# Patient Record
Sex: Female | Born: 1975 | Race: White | Hispanic: No | Marital: Married | Smoking: Never smoker
Health system: Southern US, Community
[De-identification: ages and names within clinical notes are randomized; demographics above are authoritative.]

## PROBLEM LIST (undated history)

## (undated) DIAGNOSIS — M954 Acquired deformity of chest and rib: Secondary | ICD-10-CM

## (undated) DIAGNOSIS — B019 Varicella without complication: Secondary | ICD-10-CM

## (undated) DIAGNOSIS — O039 Complete or unspecified spontaneous abortion without complication: Secondary | ICD-10-CM

## (undated) DIAGNOSIS — A499 Bacterial infection, unspecified: Secondary | ICD-10-CM

## (undated) DIAGNOSIS — N390 Urinary tract infection, site not specified: Secondary | ICD-10-CM

## (undated) HISTORY — DX: Urinary tract infection, site not specified: A49.9

## (undated) HISTORY — DX: Acquired deformity of chest and rib: M95.4

## (undated) HISTORY — DX: Varicella without complication: B01.9

## (undated) HISTORY — DX: Complete or unspecified spontaneous abortion without complication: O03.9

## (undated) HISTORY — DX: Urinary tract infection, site not specified: N39.0

## (undated) HISTORY — DX: Bacterial infection, unspecified: A49.9

## (undated) HISTORY — PX: TONSILLECTOMY: SUR1361

---

## 2004-06-27 ENCOUNTER — Ambulatory Visit: Payer: Self-pay | Admitting: Pulmonary Disease

## 2004-08-31 ENCOUNTER — Inpatient Hospital Stay (HOSPITAL_COMMUNITY): Admission: AD | Admit: 2004-08-31 | Discharge: 2004-08-31 | Payer: Self-pay | Admitting: Obstetrics & Gynecology

## 2004-11-20 ENCOUNTER — Inpatient Hospital Stay (HOSPITAL_COMMUNITY): Admission: AD | Admit: 2004-11-20 | Discharge: 2004-11-25 | Payer: Self-pay | Admitting: Obstetrics and Gynecology

## 2004-11-21 ENCOUNTER — Encounter (INDEPENDENT_AMBULATORY_CARE_PROVIDER_SITE_OTHER): Payer: Self-pay | Admitting: Specialist

## 2005-05-18 LAB — HM MAMMOGRAPHY

## 2005-05-22 ENCOUNTER — Encounter: Admission: RE | Admit: 2005-05-22 | Discharge: 2005-05-22 | Payer: Self-pay | Admitting: Obstetrics and Gynecology

## 2006-02-05 ENCOUNTER — Encounter: Admission: RE | Admit: 2006-02-05 | Discharge: 2006-02-05 | Payer: Self-pay | Admitting: Obstetrics and Gynecology

## 2007-10-03 LAB — CBC WITH DIFFERENTIAL/PLATELET
Ab Scrn: NEGATIVE
Granulocyte percent: 66.2 %G (ref 37–80)
HBsAg Conf: NONREACTIVE
HCT: 40 %
HGB: 13.5 g/dL
HIV 1/O/2 Abs, Qual: NONREACTIVE
HIV 1/O/2 Abs-Index Value: <1
MCH: 30.1
MCHC: 33.7
MCV: 89.5 fL (ref 78–100)
MID (cbc): 0.3 (ref 0–0.9)
Midpiece Defect, %: 4.2
RBC: 4.49
RDW: 12.7
RPR Ser-Titr: NONREACTIVE
WBC: 6.5
lymph#: 1.9

## 2008-07-20 LAB — RHEUMATOID FACTOR
HCT: 42 %
HGB: 14.6 g/dL
HIV 1/O/2 Abs-Index Value: <1
Hepatitis B Surface Antigen: NONREACTIVE
MCH: 31.3
MCV: 90.8 fL (ref 78–100)
RBC: 4.67
RDW: 34.5
RPR Ser-Titr: NONREACTIVE
Rubella Antibodies, IGG: UNDETERMINED

## 2008-12-21 LAB — RPR: RPR Ser-Titr: NONREACTIVE

## 2008-12-21 LAB — CBC WITH DIFFERENTIAL/PLATELET
HCT: 37 %
Hemoglobin: 13.2 g/dL (ref 11.8–15.5)
MCV: 91.4 fL (ref 78–100)
Platelets: 188 10*3/uL
RDW: 13.1
WBC: 8.2

## 2009-02-11 LAB — COMPLETE METABOLIC PANEL WITH GFR
ALT: 10 U/L (ref 7–35)
AST: 17 U/L
AST: 17 U/L
Albumin/Globulin Ratio: 1.2
Albumin/Globulin Ratio: 1.2
Albumin: 3.4
Alkaline Phosphatase: 156 U/L
Alkaline Phosphatase: 156 U/L
BUN, Bld: 6
BUN, Bld: 6
BUN/Creatinine Ratio: 13
BUN/Creatinine Ratio: 13
Calcium, serum, ionized: 9.5
Calcium: 9.5 mg/dL
Carbon Disulfide: 26
Creat: 0.48
Creatine, Serum: 0.48
GFR, Est African American: >59
GFR: >59
Globulin, Total: 2.8
Globulin: 2.8
Glucose: 80
HIV 1/O/2 Abs-Index Value: <1
Potassium: 4.8 mmol/L
Protein S Total: 6.2
Sodium, serum: 137
Sodium: 137 mmol/L (ref 137–147)

## 2009-02-11 LAB — CBC
HCT: 39 %
Hemoglobin: 13.6 g/dL (ref 11.8–15.5)
MCV: 92.5 fL (ref 78–100)
Platelets: 183 10*3/uL
RBC: 4.16
WBC: 8.6

## 2009-02-28 ENCOUNTER — Inpatient Hospital Stay (HOSPITAL_COMMUNITY): Admission: RE | Admit: 2009-02-28 | Discharge: 2009-03-02 | Payer: Self-pay | Admitting: Obstetrics

## 2009-04-12 LAB — TSH: TSH: 1.12

## 2010-11-19 LAB — CBC
HCT: 33 % — ABNORMAL LOW (ref 36.0–46.0)
HCT: 37.2 % (ref 36.0–46.0)
Hemoglobin: 11.3 g/dL — ABNORMAL LOW (ref 12.0–15.0)
Hemoglobin: 13.4 g/dL (ref 12.0–15.0)
MCHC: 34.2 g/dL (ref 30.0–36.0)
MCHC: 35.9 g/dL (ref 30.0–36.0)
MCV: 95.1 fL (ref 78.0–100.0)
MCV: 96.1 fL (ref 78.0–100.0)
Platelets: 178 10*3/uL (ref 150–400)
Platelets: 200 10*3/uL (ref 150–400)
RBC: 3.44 MIL/uL — ABNORMAL LOW (ref 3.87–5.11)
RBC: 3.91 MIL/uL (ref 3.87–5.11)
RDW: 12.7 % (ref 11.5–15.5)
RDW: 13.4 % (ref 11.5–15.5)
WBC: 8.3 10*3/uL (ref 4.0–10.5)
WBC: 9.3 10*3/uL (ref 4.0–10.5)

## 2010-11-19 LAB — RPR: RPR Ser Ql: NONREACTIVE

## 2010-12-29 NOTE — H&P (Signed)
NAME:  Jessica Osborne, Jessica Osborne NO.:  [id number]   MEDICAL RECORD NO.:  [id number]          PATIENT TYPE:  MAT   LOCATION:  [facility]                          FACILITY:  [facility]   PHYSICIAN:  Richardean Sale, M.D.   DATE OF BIRTH:  1976/04/21   DATE OF ADMISSION:  11/20/2004  DATE OF DISCHARGE:                                HISTORY & PHYSICAL   ADMITTING DIAGNOSIS:  A 40+-week intrauterine pregnancy with borderline  oligohydramnios and nonreassuring biophysical profile.   HISTORY OF PRESENT ILLNESS:  This is a 35 year old gravida 1, para 0 white  female at 40 weeks and 3 days gestation with a due date of November 17, 2004 by  LMP, consistent with ultrasound, who presented today to the office  complaining of increasing pelvic cramping, low back pain, and decreased  fetal movement.  The patient underwent ultrasound, which revealed an  amniotic fluid index of 7 and a biophysical profile of 4/8.  Prenatal care  has been at [facility] with Dr. Richardean Sale as the primary  attending.  The pregnancy has been uncomplicated.   PAST MEDICAL HISTORY:  No prior hospitalizations.   PAST SURGICAL HISTORY:  Tonsillectomy.   OBSTETRICAL HISTORY:  Gravida 1, para 0.   GYNECOLOGIC HISTORY:  Remote history of Chlamydia and cryotherapy for  abnormal Pap smear.   SOCIAL HISTORY:  She denies tobacco, alcohol, or drugs and is married.   FAMILY HISTORY:  Positive for lupus in her mother.  Aunt died from liver  cancer.  Maternal grandfather died of stroke.  No breast, ovarian, uterine,  or colon cancer.  No babies born with any birth defects, congenital  anomalies, Down syndrome, spina bifida, cystic fibrosis.   PHYSICAL EXAMINATION:  VITAL SIGNS:  Blood pressure is 140s-150s/90s, pulse  regular, afebrile.  GENERAL:  She is a well-developed, well-nourished white female who is in no  acute distress.  HEART:  Regular rate and rhythm.  LUNGS:  Lungs clear to auscultation.  ABDOMEN:  Abdomen  gravid, soft, and nontender.  Fundal height is only 35.  Heart tones in the 150s.  EXTREMITIES:  No cyanosis, clubbing, or edema.  NEUROLOGICAL EXAM:  Nonfocal.  Cervix is fingertip, 70%, -1 station, vertex.  Ultrasound reveals an estimated fetal weight of 3463 g.  Amniotic fluid  index is 7, which is the 8th percentile.  Biophysical profile is 4/8 with -2  for tone and -2 for breathing.   PRENATAL LABORATORIES:  Blood type is B-, antibody screen negative, RPR  nonreactive, Rubella immune, hepatitis B surface antigen nonreactive, HIV  declined.  Toxoplasmosis titer is negative.  Triple test within normal  limits.  One-hour Glucola 103.  Group B beta strep negative.  Status post  RhoGAM at 28 weeks.   ASSESSMENT:  A 35 year old gravid 1, para 0 white female at 40+ weeks  gestation with borderline oligohydramnios and biophysical profile of only  4/8.   PLAN:  1.  I recommend induction of labor given biophysical profile.  If fetal      heart tracing is reactive, we will proceed with Cervidil for cervical  ripening followed by Pitocin and amniotomy.  2.  Continue fetal monitoring.  3.  Elevated blood pressures.  We will check preeclampsia labs and monitor      pressures closely.  4.  Reviewed with the patient and husband increased risk of cesarean section      given that she is primigravida and her cervix is unfavorable.  The      patient and husband voiced understanding of plan and recommendation for      induction and slightly increased risk of cesarean section and agree with      the above.  All questions have been answered.      JW/MEDQ  D:  11/20/2004  T:  11/20/2004  Job:  [id number]

## 2010-12-29 NOTE — Discharge Summary (Signed)
Jessica Osborne, Jessica Osborne                ACCOUNT NO.:  [id number]   MEDICAL RECORD NO.:  [id number]          PATIENT TYPE:  INP   LOCATION:  9105                          FACILITY:  [facility]   PHYSICIAN:  Richardean Sale, M.D.   DATE OF BIRTH:  Apr 21, 1976   DATE OF ADMISSION:  11/20/2004  DATE OF DISCHARGE:  11/25/2004                                 DISCHARGE SUMMARY   ADMISSION DIAGNOSIS:  A 40+ week intrauterine pregnancy with borderline  oligohydramnios and non-reassuring biophysical profile.   DISCHARGE DIAGNOSES:  1.  Same.  2. Status post vaginal delivery.  3. Severe pre-eclampsia with      HELLP syndrome.  4. Vulvar hematoma.   HOSPITAL COURSE AND HISTORY OF PRESENT ILLNESS:  This is a 35 year old  gravida 1 white female who was admitted for 40 weeks and 3 days gestation  secondary to decreased fetal movement.  The patient underwent ultrasound and  biophysical profile which showed an amniotic fluid index of 7 and a  biophysical profile of only 4/8.  She was subsequently admitted for  induction of labor and Cervidil overnight.  The patient's blood pressures on  admission were slightly elevated.  Pre-eclampsia labs were drawn on  admission which showed mild elevation in her SGOT and SGPT.  The patient was  subsequently started on magnesium sulfate for seizure prophylaxis.  She  underwent a successful induction of labor.  The patient rapidly progressed  in labor with Cervidil and prior to placing her epidural, repeat  laboratories or studies were drawn.  Her platelet count which was initially  153,000 on admission returned at only 61,000.  The patient subsequently was  unable to receive an epidural.  She had a successful vaginal delivery of a  viable female infant with Apgars of 8 and 8.  Postpartum, the patient was  admitted to the [facility] and continued on magnesium sulfate for seizure  prophylaxis.  The patient's laboratory studies were consistent with HELLP  syndrome.  Her platelet count  dropped to the 40,000s.  ALT and AST were in  the 100s.  LDH was elevated 455.  Coagulation studies were normal.  On  postpartum day #1, the patient developed spontaneous vulvar hematoma that  was treated conservatively and remained stable throughout her postpartum  stay.  The patient received dexamethasone given that her thrombocytopenia  persisted into postpartum day  #2.  The patient ultimately showed  improvement in her laboratory studies as well as in her blood pressures.  Hemoglobin stabilized at 7.4.  The patient did not receive a transfusion as  she was hemodynamically stable and was asymptomatic.  The patient continued  to improve and on postpartum day #4, she was discharged to home in stable  condition.   DISPOSITION:  To home.   CONDITION:  Stable.   FOLLOW UP:  The patient will follow up in one week for repeat blood pressure  check and for monitoring the vulvar hematoma and then again in four to six  weeks for a routine postpartum visit.   INSTRUCTIONS:  She is to call for any headache, blurred vision, epigastric  pain, heavy vaginal bleeding, pain not relieved with pain medications, fever  greater than 100.5.   DISCHARGE MEDICATIONS:  1.  Iron supplement 325 mg twice daily.  2. Motrin 800 mg p.o. q.8h. p.r.n.      pain.      JW/MEDQ  D:  12/28/2004  T:  12/29/2004  Job:  [id number]

## 2011-08-02 ENCOUNTER — Other Ambulatory Visit (HOSPITAL_BASED_OUTPATIENT_CLINIC_OR_DEPARTMENT_OTHER): Payer: Self-pay | Admitting: Family Medicine

## 2011-08-02 ENCOUNTER — Ambulatory Visit (HOSPITAL_BASED_OUTPATIENT_CLINIC_OR_DEPARTMENT_OTHER)
Admission: RE | Admit: 2011-08-02 | Discharge: 2011-08-02 | Disposition: A | Discharge status: Home / Self Care | Payer: BC Managed Care – PPO | Source: Ambulatory Visit | Attending: Family Medicine | Admitting: Family Medicine

## 2011-08-02 ENCOUNTER — Other Ambulatory Visit (HOSPITAL_BASED_OUTPATIENT_CLINIC_OR_DEPARTMENT_OTHER): Payer: Self-pay | Admitting: *Deleted

## 2011-08-02 DIAGNOSIS — R109 Unspecified abdominal pain: Secondary | ICD-10-CM

## 2011-08-02 DIAGNOSIS — R319 Hematuria, unspecified: Secondary | ICD-10-CM

## 2011-08-02 DIAGNOSIS — R599 Enlarged lymph nodes, unspecified: Secondary | ICD-10-CM | POA: Insufficient documentation

## 2011-08-02 DIAGNOSIS — J984 Other disorders of lung: Secondary | ICD-10-CM

## 2011-08-02 DIAGNOSIS — R1012 Left upper quadrant pain: Secondary | ICD-10-CM

## 2011-08-02 DIAGNOSIS — R918 Other nonspecific abnormal finding of lung field: Secondary | ICD-10-CM

## 2011-08-02 DIAGNOSIS — R509 Fever, unspecified: Secondary | ICD-10-CM | POA: Insufficient documentation

## 2011-08-02 MED ORDER — IOHEXOL 300 MG/ML  SOLN: 80.0000 mL | Freq: Once | INTRAMUSCULAR | Status: AC | PRN

## 2011-10-16 LAB — LIPID PANEL
Cholesterol, Total: 171
HDL: 77 mg/dL — AB (ref 35–70)
LDL CALC: 87
Triglycerides: 34
VLDL: 7 mg/dL

## 2011-10-16 LAB — HM PAP SMEAR: HM Pap smear: NEGATIVE

## 2011-10-16 LAB — HPV, HIGH+LOW-RISK
HPV, high-risk: NEGATIVE
TSH: 1.48

## 2011-11-27 LAB — COMPLETE METABOLIC PANEL WITHOUT GFR
ALT: 10 U/L (ref 7–35)
AST: 12 U/L
Albumin/Globulin Ratio: 1.6
Albumin: 4.5
Alkaline Phosphatase: 58 U/L
BUN/Creatinine Ratio: 21
BUN: 13 mg/dL (ref 4–21)
Calcium: 9 mg/dL
Carbon Disulfide: 24
Chloride: 103 mmol/L
Creat: 0.63
GFR, Est African American: 133
GFR, Est Non African American: 115
Globulin, Total: 2.9
Glucose: 85
Hgb A1c MFr Bld: 5.2 % (ref 4.0–6.0)
Potassium: 4.2 mmol/L
Sodium: 140 mmol/L (ref 137–147)
TSH: 1.48
Total Bilirubin: 0.8 mg/dL
Total Protein: 7.4 g/dL

## 2011-11-27 LAB — COMPLETE METABOLIC PANEL WITH GFR
Albumin, Serum(Neph): 4.5
Albumin/Globulin Ratio: 1.6
Alkaline Phosphatase: 58 U/L
BUN/Creatinine Ratio: 21
BUN: 13 mg/dL (ref 4–21)
Calcium, serum, ionized: 9
Chloride, Serum: 103
Creatinine, Ser: 0.63
GFR, Est Non African American: 115
Globulin, Total: 2.9
Glucose: 85
Hgb A1c MFr Bld: 5.2 % (ref 4.0–6.0)
Protein S Total: 7.4
Sodium, serum: 140

## 2011-11-27 LAB — CBC
Cholesterol, Total: 171
HDL: 77 mg/dL — AB (ref 35–70)
LDL Calculated: 87 mg/dL
Triglycerides: 34
VLDL Cholesterol Cal: 7

## 2013-02-17 DIAGNOSIS — J45909 Unspecified asthma, uncomplicated: Secondary | ICD-10-CM | POA: Insufficient documentation

## 2013-02-25 LAB — COMPLETE METABOLIC PANEL WITH GFR
AST: 12 U/L
Albumin/Glob SerPl: 1
Alkaline Phosphatase: 57 U/L
Antichromatin IgG, Antibodies: <0.2
BUN/Creatinine Ratio: 14
CO2: 23 mmol/L
Creatinine, Ser: 0.64
Globulin, Total: 4.2
Latex: 8.4
SSA (Ro) Ab, IgG-Sjogrens: <0.2
SSB (La) (ENA) Antibody, IgG: <0.2
Sed Rate: 28
Smith-Lemli-Opitz Scrn, P: <0.2
Sodium: 138 mmol/L (ref 137–147)
Total Bilirubin: 0.8 mg/dL

## 2013-02-25 LAB — IMMUNOFIXATION ELECTROPHORESIS
Albumin/Glob SerPl: 3.7
Beta Globulin: 0.8
Gamma Globulin: 1.1
Globulin, Total: 1.5
IFE Result, Cryoprecipitant: 1.4

## 2013-03-09 ENCOUNTER — Telehealth: Payer: Self-pay | Admitting: Critical Care Medicine

## 2013-03-09 NOTE — Telephone Encounter (Signed)
I spoke with pt. She is scheduled to see RA in [place] tomorrow at 10:15. Aware to arrive at 10 to fill out paperwork. She is also aware of address. Nothing further was needed

## 2013-03-10 ENCOUNTER — Institutional Professional Consult (permissible substitution): Payer: BC Managed Care – PPO | Admitting: Pulmonary Disease

## 2013-03-11 ENCOUNTER — Encounter: Payer: Self-pay | Admitting: Emergency Medicine

## 2013-03-11 ENCOUNTER — Ambulatory Visit (INDEPENDENT_AMBULATORY_CARE_PROVIDER_SITE_OTHER): Payer: BC Managed Care – PPO | Admitting: Emergency Medicine

## 2013-03-11 VITALS — BP 110/80 | HR 79 | Temp 97.9°F | Ht 66.0 in | Wt 108.8 lb

## 2013-03-11 DIAGNOSIS — R9389 Abnormal findings on diagnostic imaging of other specified body structures: Secondary | ICD-10-CM | POA: Insufficient documentation

## 2013-03-11 DIAGNOSIS — J45909 Unspecified asthma, uncomplicated: Secondary | ICD-10-CM | POA: Insufficient documentation

## 2013-03-11 NOTE — Patient Instructions (Addendum)
We will get copies of your CXR and CT scan from [facility] Please stop your Pulmicort for now Continue to have albuterol available to use as needed for shortness of breath We will perform full pulmonary function testing at your next office visit Follow with Dr Delton Coombes next available appointment with full PFT's

## 2013-03-11 NOTE — Progress Notes (Signed)
Subjective:    Patient ID: Jessica Osborne, female    DOB: 02-16-1976, 37 y.o.   MRN: [id number]  HPI 37 yo never smoker, hx of HA and little other PMH xcept for 2 episodes of CAP in 2012, 2013 in LLL. These were characterized by L flank and back pain, f/c, aches, little cough without mucous. Then in May '14 had the pain, dyspnea. Could hurt w insp, movement, still happens. Some cough, non productive. She was started on pulmicort in May, seemed to help her breathing some. Albuterol prn also helpful. CT scan in May showed LLL infiltrate with ? Nodular component. Spirometry recently w ? Obstruction.  She was tested for autoimmune processes July > all negative (labs scanned in).    CT 08/02/11 >> LL PNA with some associated mediastinal LAD CT scan 02/26/13 >> unavailable to me; radiology report shows infiltrate dorsal LLL with small irregular nodular opacity 6x31mm, no mediastinal LAD.    Review of Systems  Constitutional: Positive for unexpected weight change. Negative for fever.  HENT: Positive for dental problem. Negative for ear pain, nosebleeds, congestion, sore throat, rhinorrhea, sneezing, trouble swallowing, postnasal drip and sinus pressure.   Eyes: Negative for redness and itching.  Respiratory: Positive for chest tightness and shortness of breath. Negative for cough and wheezing.   Cardiovascular: Positive for chest pain. Negative for palpitations and leg swelling.  Gastrointestinal: Negative for nausea and vomiting.  Genitourinary: Negative for dysuria.  Musculoskeletal: Negative for joint swelling.  Skin: Negative for rash.  Neurological: Positive for headaches.  Hematological: Does not bruise/bleed easily.  Psychiatric/Behavioral: Negative for dysphoric mood. The patient is not nervous/anxious.    No past medical history on file.    Family History  Problem Relation Age of Onset  . Melanoma Maternal Aunt   . Asthma Brother   . Allergies Brother   . Rheum arthritis Mother       History   Social History  . Marital Status: Married    Spouse Name: N/A    Number of Children: 2  . Years of Education: N/A   Occupational History  . stay at home mother    Social History Main Topics  . Smoking status: Never Smoker   . Smokeless tobacco: Never Used  . Alcohol Use: No  . Drug Use: No  . Sexually Active: Not on file   Other Topics Concern  . Not on file   Social History Narrative  . No narrative on file  No current breathing exposures. She had a former home with aspergillus exposure, got out 1 yr ago. No pets. No bird exposure.   Allergies  Allergen Reactions  . Penicillins Rash     No outpatient prescriptions prior to visit.   No facility-administered medications prior to visit.       Objective:   Physical Exam Filed Vitals:   03/11/13 1046  BP: 110/80  Pulse: 79  Temp: 97.9 F (36.6 C)  TempSrc: Oral  Height: 5\' 6"  (1.676 m)  Weight: 108 lb 12.8 oz (49.351 kg)  SpO2: 100%   Gen: Pleasant, thin, in no distress,  normal affect  ENT: No lesions,  mouth clear,  oropharynx clear, no postnasal drip  Neck: No JVD, no TMG, no carotid bruits  Lungs: No use of accessory muscles, few soft exp squeeks at end-exp on L  Cardiovascular: RRR, heart sounds normal, no murmur or gallops, no peripheral edema  Musculoskeletal: No deformities, no cyanosis or clubbing, mild L intercostal tenderness to palp  Neuro: alert, non focal  Skin: Warm, no lesions or rashes     Assessment & Plan:  Unspecified asthma(493.90) Unclear dx - certainly there are inflammatory diseases that will cause nodular infiltrates and obstructive disease.  - stop pulmicort for now - full PFT - rov next available  Abnormal CT scan, chest Etiology unclear, but consider a primary inflammatory process, atypical infxn, least likely would be malignancy. Her autoimmune panel from 02/25/13 is negative (has family hx RA and SLE) - need to get the CT scan and CXR from [facility] -  consider FOB or alternative bx depending on the CT

## 2013-03-11 NOTE — Assessment & Plan Note (Signed)
Etiology unclear, but consider a primary inflammatory process, atypical infxn, least likely would be malignancy. Her autoimmune panel from 02/25/13 is negative (has family hx RA and SLE) - need to get the CT scan and CXR from [facility] - consider FOB or alternative bx depending on the CT

## 2013-03-11 NOTE — Assessment & Plan Note (Signed)
Unclear dx - certainly there are inflammatory diseases that will cause nodular infiltrates and obstructive disease.  - stop pulmicort for now - full PFT - rov next available

## 2013-03-13 ENCOUNTER — Telehealth: Payer: Self-pay | Admitting: Emergency Medicine

## 2013-03-13 NOTE — Telephone Encounter (Signed)
Received 10 pages from [facility], sent to Dr. Delton Coombes. 03/13/13/ss

## 2013-03-13 NOTE — Telephone Encounter (Signed)
Her records have arrived, but the new disk has not gotten here yet. I need this to compare the films. We have asked for it.

## 2013-03-13 NOTE — Telephone Encounter (Signed)
I spoke with pt. She is wanting to know if we were able to view her images yet. She saw RB 03/11/13. Please advise RB thanks

## 2013-03-13 NOTE — Telephone Encounter (Signed)
Received 3 pages from [facility], sent to Dr. Delton Coombes on 03/13/13/ss

## 2013-03-16 ENCOUNTER — Telehealth: Payer: Self-pay | Admitting: Emergency Medicine

## 2013-03-16 NOTE — Telephone Encounter (Signed)
Spoke with patient, Made her aware disc have not arrived yet but we have requested for them Patient verbalized understanding and nothing further needed at this time.

## 2013-03-16 NOTE — Telephone Encounter (Signed)
Leslye Peer, MD at 03/13/2013 5:14 PM    Status: Signed             Her records have arrived, but the new disk has not gotten here yet. I need this to compare the films. We have asked for it   Pt aware

## 2013-03-17 ENCOUNTER — Telehealth: Payer: Self-pay | Admitting: Emergency Medicine

## 2013-03-17 NOTE — Telephone Encounter (Signed)
Spoke with patients spouse Made him aware that we have received CT scan report on paper are just waiting for disc I informed spouse that the CT patient had done in 2012 in [place] is in the Epic system Per spouse he will also contact [facility]ry[facility] for disc Will contact office in AM to ensure they have received request

## 2013-03-18 ENCOUNTER — Ambulatory Visit (INDEPENDENT_AMBULATORY_CARE_PROVIDER_SITE_OTHER): Payer: BC Managed Care – PPO | Admitting: Emergency Medicine

## 2013-03-18 DIAGNOSIS — R9389 Abnormal findings on diagnostic imaging of other specified body structures: Secondary | ICD-10-CM

## 2013-03-18 DIAGNOSIS — J45909 Unspecified asthma, uncomplicated: Secondary | ICD-10-CM

## 2013-03-18 NOTE — Telephone Encounter (Signed)
Pt's spouse states that pt dropped off a CT disc this AM.  Would like to ensure that this one is working.  Please call pt's spouse at [phone].  Antionette Fairy

## 2013-03-18 NOTE — Telephone Encounter (Signed)
Checked to ensure request was received @ [phone]  per Tech at [facility] patient has already picked up CT disc ATC patient to ensure this is correct, no answer [facility]

## 2013-03-18 NOTE — Progress Notes (Signed)
PFT done. Saladin Petrelli, CMA  

## 2013-03-19 NOTE — Telephone Encounter (Signed)
Lauren, do you have this disc? Please advise thanks

## 2013-03-19 NOTE — Telephone Encounter (Signed)
I advised pt we are checking with the nurse to see. Pt aware. Will forward to lauren

## 2013-03-19 NOTE — Telephone Encounter (Signed)
Patient calling back.  [phone]

## 2013-03-20 NOTE — Telephone Encounter (Signed)
ATC patient at number below, no answer [id number]  Note: Disc has been received and I have put in [facility] and disc does work If patient wants comparison from CT two years ago w CT from this year will need to be advised that RB is out of office until Aug 19, possibly Doc of the day can advise?

## 2013-03-20 NOTE — Telephone Encounter (Signed)
Pt is checking on status.  Please call at [phone].  Antionette Fairy

## 2013-03-20 NOTE — Telephone Encounter (Signed)
Defer to RB

## 2013-03-20 NOTE — Telephone Encounter (Signed)
Spoke with patient-- Advised that CT disc was received and does work. Patient requesting comparison from scan in 2012 (in Epic) to scan in 2014 (on disc in office) RB is out of office until Aug 19 and disc is in office, patient requesting comparison as soon as possible Dr. Vassie Loll may you advise as you are Doc of the morning

## 2013-03-20 NOTE — Telephone Encounter (Signed)
Pt returned call. Jessica Osborne °

## 2013-03-20 NOTE — Telephone Encounter (Signed)
RB called the office and I spoke with me. He stated he is over in the hospital and has not been able to get over here. As soon as he gets a chance to come over and review it, he will. He has couple procedures to do as well.   I called and spoke with pt and made her aware as soon as it is reviewed we will call. Can't guarantee when this will be though. She stated that was fine. Will forward back to RB as FYI

## 2013-03-20 NOTE — Telephone Encounter (Signed)
Dr Byrum please advise, thank you. 

## 2013-03-23 NOTE — Telephone Encounter (Signed)
Discussed Ct scan results with Jessica Osborne. Her new film shows a LLL nodular/GG opacity near the posterior pleura, not the same location as her prior LLL infiltrate (also w a nodular component) in 2012. I don't know if the 2 processes were the same thing - I definitely do not believe that the new opacity represents simple scarring left over from her abnormalities from 2012. This could represent infectious or non-infectious inflammatory process, malignancy.   We will plan to repeat Ct scan in mid-September with superD cuts to see if there is interval change and to allow ENB if indicated. I will see her 8/27 to arrange.

## 2013-03-30 ENCOUNTER — Telehealth: Payer: Self-pay | Admitting: Emergency Medicine

## 2013-03-30 NOTE — Telephone Encounter (Signed)
I spoke with the pt and she states last night she went to the ER due to back pain and they did another CT and recommended she contact us for a bronch. She states they advised the CT looked like she had some type of infectious process happening. Pt wants to know how to proceed. She went to [facility] ER. Pt has PFT sone on 03-18-13. Carron Curie, CMA  Please advise.

## 2013-03-31 NOTE — Telephone Encounter (Signed)
I spoke with pt and she calling to f.u on message. I advised her RB would be in this afternoon. Pt was not able to come in for appt today as RB has an opening. Please advise thanks

## 2013-03-31 NOTE — Telephone Encounter (Signed)
Pt is requesting the status of this message.  Pt is wanting to get bronch this wk if possible & would like RB to do this since he is familiar w/ her situation.  Antionette Fairy

## 2013-03-31 NOTE — Telephone Encounter (Signed)
Please let her know I need the films from [facility] to compare. As we discussed earlier this month, she may need bronchoscopy but it will be higher yield if we do it using special techniques that take advantage of her CT scan data. We will discuss the new CT scan and plan our next steps.

## 2013-03-31 NOTE — Telephone Encounter (Signed)
Pt called again in ref to previous msg says she needs a call asap.Jessica Osborne

## 2013-04-01 ENCOUNTER — Telehealth: Payer: Self-pay | Admitting: Emergency Medicine

## 2013-04-01 NOTE — Telephone Encounter (Signed)
Called spoke with patient who stated that she went to the ER @ [facility] on 8.17.14 for some chest pain/chest pressure/fatigue.  Pt stated that per the ER doc and CT Chest findings, she was rec'd to follow up w/ RB for possible bronch.  Patient already has an appt scheduled on 8.27.14 @ 11:15am.  Pt is concerned and would like to know if RB thinks she needs to be seen prior to her appt.  TP not in office today, no openings this week.  MW with openings in Friday 8.22.14.  Records are in Care Everywhere Dr Delton Coombes please advise, thanks!  FINDINGS: CHEST: . Chest wall/thoracic inlet: Within normal limits. . Thyroid: Within normal limits. . Mediastinum/hila: Within normal limits. . Heart/vessels: Within normal limits. . Lungs: Calcified right upper lobe granuloma. Nonspecific 3 mm left lower lobe nodule (series 2, image 106 ). Left lower lobe tree-in-bud nodularity, which may represent acute infectious bronchiolitis or sequela of prior infection. . Pleura: Within normal limits.  Result Impression  1. No CT findings to account for right flank pain. 2. Mildly distended bladder with associated mild distention of the right ureter. No obstructing ureteral calculi, hydronephrosis, or bladder wall thickening. 3. Appendicolith without CT evidence of acute appendicitis. 4. Left lower lobe tree-in-bud nodularity, which may represent acute infectious bronchiolitis or sequela of prior infection. 5. Ancillary findings as above.

## 2013-04-01 NOTE — Telephone Encounter (Signed)
ATC patient, no answer [id number] 

## 2013-04-01 NOTE — Telephone Encounter (Signed)
I do not need to see her until her scheduled appointment. I want to wait to see if the current findings clear up (from when she was treated prior to our original meeting). This is why I wasn't planning to repeat her CT scan until September. I will review the new scan, compare to her priors and we will decide whether she needs a scan later or whether we can decide about bronchoscopy based on what we already have. Thanks

## 2013-04-01 NOTE — Telephone Encounter (Signed)
ATC patient no answer [id number] 

## 2013-04-02 ENCOUNTER — Institutional Professional Consult (permissible substitution): Payer: BC Managed Care – PPO | Admitting: Critical Care Medicine

## 2013-04-02 NOTE — Telephone Encounter (Signed)
Patient returning call.

## 2013-04-02 NOTE — Telephone Encounter (Signed)
Spoke with patient, made her aware of recs per Dr. Delton Coombes Patient verbalized understanding and nothing further needed at this time

## 2013-04-02 NOTE — Telephone Encounter (Signed)
Spoke with patient, informed her of recs per Dr. Delton Coombes  Patient verbalized understanding nothing further needed

## 2013-04-08 ENCOUNTER — Ambulatory Visit (INDEPENDENT_AMBULATORY_CARE_PROVIDER_SITE_OTHER): Payer: BC Managed Care – PPO | Admitting: Emergency Medicine

## 2013-04-08 ENCOUNTER — Encounter: Payer: Self-pay | Admitting: Emergency Medicine

## 2013-04-08 VITALS — BP 110/80 | HR 80 | Temp 98.8°F | Ht 66.0 in | Wt 106.4 lb

## 2013-04-08 DIAGNOSIS — R9389 Abnormal findings on diagnostic imaging of other specified body structures: Secondary | ICD-10-CM

## 2013-04-08 DIAGNOSIS — J45909 Unspecified asthma, uncomplicated: Secondary | ICD-10-CM

## 2013-04-08 NOTE — Assessment & Plan Note (Signed)
PFTs do not show overt asthma although she does have a high residual volume.  - Will not start scheduled therapy at this time - Continue albuterol as needed

## 2013-04-08 NOTE — Patient Instructions (Addendum)
We will arrange for bronchoscopy next week Please continue to use albuterol 2 puffs if needed for chest tightness or shortness of breath Follow with Dr Delton Coombes in 1 month

## 2013-04-08 NOTE — Progress Notes (Signed)
Subjective:    Patient ID: Jessica Osborne, female    DOB: 11/05/1975, 37 y.o.   MRN: [id number]  HPI 37 yo never smoker, hx of HA and little other PMH xcept for 2 episodes of CAP in 2012, 2013 in LLL. These were characterized by L flank and back pain, f/c, aches, little cough without mucous. Then in May '14 had the pain, dyspnea. Could hurt w insp, movement, still happens. Some cough, non productive. She was started on pulmicort in May, seemed to help her breathing some. Albuterol prn also helpful. CT scan in May showed LLL infiltrate with ? Nodular component. Spirometry recently w ? Obstruction.  She was tested for autoimmune processes July > all negative (labs scanned in).    CT 08/02/11 >> LL PNA with some associated mediastinal LAD CT scan 02/26/13 >> unavailable to me; radiology report shows infiltrate dorsal LLL with small irregular nodular opacity 6x8mm, no mediastinal LAD.   ROV 04/08/13 -- returns for followup regarding abnormal CT scan of the chest as above. Since this time have been able to review her CT scan from 02/26/13. There is a left lower lobe nodular infiltrate in a different location compared with her film in 2012. This had a groundglass component. Since her last visit she had another trip to the emergency department and a another CT was performed at [place], appears to be a CT abdomen and pelvis on 03/30/13. The left lower lobe tree in bud nodularity was again seen.    Review of Systems  Constitutional: Positive for unexpected weight change. Negative for fever.  HENT: Positive for dental problem. Negative for ear pain, nosebleeds, congestion, sore throat, rhinorrhea, sneezing, trouble swallowing, postnasal drip and sinus pressure.   Eyes: Negative for redness and itching.  Respiratory: Positive for chest tightness and shortness of breath. Negative for cough and wheezing.   Cardiovascular: Positive for chest pain. Negative for palpitations and leg swelling.  Gastrointestinal:  Negative for nausea and vomiting.  Genitourinary: Negative for dysuria.  Musculoskeletal: Negative for joint swelling.  Skin: Negative for rash.  Neurological: Positive for headaches.  Hematological: Does not bruise/bleed easily.  Psychiatric/Behavioral: Negative for dysphoric mood. The patient is not nervous/anxious.       Objective:   Physical Exam Filed Vitals:   04/08/13 1042  BP: 110/80  Pulse: 80  Temp: 98.8 F (37.1 C)  TempSrc: Oral  Height: 5' 6" (1.676 m)  Weight: 106 lb 6.4 oz (48.263 kg)  SpO2: 99%   Gen: Pleasant, thin, in no distress,  normal affect  ENT: No lesions,  mouth clear,  oropharynx clear, no postnasal drip  Neck: No JVD, no TMG, no carotid bruits  Lungs: No use of accessory muscles, few soft exp squeeks at end-exp on L  Cardiovascular: RRR, heart sounds normal, no murmur or gallops, no peripheral edema  Musculoskeletal: No deformities, no cyanosis or clubbing, mild L intercostal tenderness to palp  Neuro: alert, non focal  Skin: Warm, no lesions or rashes     Assessment & Plan:  Abnormal CT scan, chest CT scan of the abdomen and pelvis performed 8/18 apparently shows persistence of her left lower lobe tree in bud nodularity. We discussed proceeding with bronchoscopy now versus waiting to repeat a CT to facilitate ENB. We have decided to perform standard bronchoscopy now. This will be performed next week  Unspecified asthma(493.90) PFTs do not show overt asthma although she does have a high residual volume.  - Will not start scheduled therapy at   this time - Continue albuterol as needed    

## 2013-04-08 NOTE — Assessment & Plan Note (Signed)
CT scan of the abdomen and pelvis performed 8/18 apparently shows persistence of her left lower lobe tree in bud nodularity. We discussed proceeding with bronchoscopy now versus waiting to repeat a CT to facilitate ENB. We have decided to perform standard bronchoscopy now. This will be performed next week

## 2013-04-10 ENCOUNTER — Ambulatory Visit: Payer: BC Managed Care – PPO | Admitting: Emergency Medicine

## 2013-04-11 LAB — POCT URINALYSIS DIPSTICK
Blood, UA: NEGATIVE
Ketones, UA: NEGATIVE
Protein, UA: NEGATIVE
Spec Grav, UA: <=1.005
Urobilinogen, UA: 0.2

## 2013-04-14 ENCOUNTER — Telehealth: Payer: Self-pay | Admitting: Emergency Medicine

## 2013-04-14 NOTE — Telephone Encounter (Signed)
Yes, please let her know that I will look in all airways, both sides. Thanks

## 2013-04-14 NOTE — Telephone Encounter (Signed)
i spoke with pt. She stated she had CT scan 8/24 she believes and it was at [facility]. She reports RB advised her he is going to look at her LL for procedure and she states the CT reports about "tree and bud" on the UL. She is wanting to sure RB looks at everything. Please advise thanks

## 2013-04-14 NOTE — Telephone Encounter (Signed)
i spoke with pt and made her aware. Nothing further needed

## 2013-04-15 ENCOUNTER — Encounter (HOSPITAL_COMMUNITY): Payer: Self-pay

## 2013-04-16 ENCOUNTER — Ambulatory Visit (HOSPITAL_COMMUNITY): Payer: BC Managed Care – PPO

## 2013-04-16 ENCOUNTER — Encounter (HOSPITAL_COMMUNITY): Payer: Self-pay | Admitting: Radiology

## 2013-04-16 ENCOUNTER — Encounter (HOSPITAL_COMMUNITY)
Admission: RE | Disposition: A | Discharge status: Home / Self Care | Payer: Self-pay | Source: Ambulatory Visit | Attending: Emergency Medicine

## 2013-04-16 ENCOUNTER — Ambulatory Visit (HOSPITAL_COMMUNITY)
Admission: RE | Admit: 2013-04-16 | Discharge: 2013-04-16 | Disposition: A | Discharge status: Home / Self Care | Payer: BC Managed Care – PPO | Source: Ambulatory Visit | Attending: Emergency Medicine | Admitting: Emergency Medicine

## 2013-04-16 DIAGNOSIS — R918 Other nonspecific abnormal finding of lung field: Secondary | ICD-10-CM | POA: Insufficient documentation

## 2013-04-16 DIAGNOSIS — R9389 Abnormal findings on diagnostic imaging of other specified body structures: Secondary | ICD-10-CM

## 2013-04-16 HISTORY — PX: VIDEO BRONCHOSCOPY: SHX5072

## 2013-04-16 SURGERY — BRONCHOSCOPY, WITH FLUOROSCOPY
Anesthesia: Moderate Sedation | Laterality: Bilateral

## 2013-04-16 MED ORDER — LIDOCAINE HCL 2 % EX GEL
CUTANEOUS | Status: DC | PRN
  Administered 2013-04-16: 1

## 2013-04-16 MED ORDER — PHENYLEPHRINE HCL 0.25 % NA SOLN
NASAL | Status: DC | PRN
  Administered 2013-04-16: 2 via NASAL

## 2013-04-16 MED ORDER — MIDAZOLAM HCL 10 MG/2ML IJ SOLN
INTRAMUSCULAR | Status: AC
  Filled 2013-04-16: qty 4

## 2013-04-16 MED ORDER — FENTANYL CITRATE 0.05 MG/ML IJ SOLN
INTRAMUSCULAR | Status: DC | PRN
  Administered 2013-04-16 (×2): 25 ug via INTRAVENOUS
  Administered 2013-04-16: 50 ug via INTRAVENOUS

## 2013-04-16 MED ORDER — FENTANYL CITRATE 0.05 MG/ML IJ SOLN
INTRAMUSCULAR | Status: AC
  Filled 2013-04-16: qty 4

## 2013-04-16 MED ORDER — LIDOCAINE HCL 1 % IJ SOLN
INTRAMUSCULAR | Status: DC | PRN
  Administered 2013-04-16: 6 mL via RESPIRATORY_TRACT

## 2013-04-16 MED ORDER — MIDAZOLAM HCL 10 MG/2ML IJ SOLN
INTRAMUSCULAR | Status: DC | PRN
  Administered 2013-04-16 (×2): 2 mg via INTRAVENOUS
  Administered 2013-04-16: 3 mg via INTRAVENOUS

## 2013-04-16 NOTE — Progress Notes (Signed)
Video bronchoscopy with washing intervention, biopsy intervention, brushing intervention  Levy Pupa, MD, PhD 04/17/2013, 9:54 AM [facility] [phone] or if no answer [phone]

## 2013-04-16 NOTE — Op Note (Signed)
Video Bronchoscopy Procedure Note  Date of Operation: 04/16/2013  Pre-op Diagnosis: L micronodular infiltrates  Post-op Diagnosis: Same  Surgeon: Levy Pupa  Assistants: none  Anesthesia: conscious sedation, moderate sedation  Meds Given: fentanyl , versed 7mg  in divided doses, 1% lidocaine 24cc total  Operation: Flexible video fiberoptic bronchoscopy and biopsies.  Estimated Blood Loss: 0cc  Complications: none noted  Indications and History: Jessica Osborne is 37 y.o. with history of recurrent PNA. She has undergone serial CT scans of the chest that show persistent nodular infiltrates, particularly involving the LLL.  Recommendation was to perform video fiberoptic bronchoscopy with biopsies and washings. The risks, benefits, complications, treatment options and expected outcomes were discussed with the patient.  The possibilities of pneumothorax, pneumonia, reaction to medication, pulmonary aspiration, perforation of a viscus, bleeding, failure to diagnose a condition and creating a complication requiring transfusion or operation were discussed with the patient who freely signed the consent.    Description of Procedure: The patient was seen in the Preoperative Area, was examined and was deemed appropriate to proceed.  The patient was taken to [facility], identified as Jessica Osborne and the procedure verified as Flexible Video Fiberoptic Bronchoscopy.  A Time Out was held and the above information confirmed.   Conscious sedation was initiated as indicated above. The video fiberoptic bronchoscope was introduced via the R nare and a general inspection was performed which showed significant posterior pharyngeal cobblestoning consistent with chronic irritation and possible GERD. There were normal cords, normal trachea, normal main carina. The R sided airways were inspected and showed normal RUL, BI, RML and RLL. The L side was then inspected. The LLL, Lingular and LUL airways  were normal. The entire exam was normal without any endobronchial lesion of abnormal secretions. Under Fluoroscopic guidance transbronchial brushings were performed in several segments of the LLL to make clear slides for AFB and fungal staining. Then transbronchial biopsies were performed in several LLL segments to be sent for pathology. Finally, 2 BAL's were performed in the Superior Segment and then the Posterior Segment of the LLL. 60cc NS was instilled - 30cc returned from superior segment, 22cc returned from the posterior segment. These will be sent for micro and cytology. There was No bleeding. There were no obvious complications. CXR is pending.    Samples: 1. Transbronchial brushings from LLL 2. Transbronchial biopsies from LLL 3. BAL from the LLL Superior Segment 4. BAL from the LLL Posterior Segment  Plans:  We will review the cytology, pathology and microbiology results with the patient when they become available.  Outpatient followup will be with Dr Delton Coombes.    Levy Pupa, MD, PhD 04/16/2013, 2:45 PM [facility] [phone] or if no answer [phone]

## 2013-04-16 NOTE — Interval H&P Note (Signed)
PCCM Interval Note  No new issues, all questions answered.  Filed Vitals:   04/16/13 1335 04/16/13 1340 04/16/13 1345 04/16/13 1350  BP: 117/92 124/89 128/85 128/85  Pulse: 108 118 111 104  Resp: 22 55 40 18  Height:      Weight:      SpO2: 100% 99% 100% 99%   Plans:  Will proceed with FOB + BAL and Bx's

## 2013-04-16 NOTE — H&P (View-Only) (Signed)
Subjective:    Patient ID: Jessica Osborne, female    DOB: December 04, 1975, 37 y.o.   MRN: [id number]  HPI 37 yo never smoker, hx of HA and little other PMH xcept for 2 episodes of CAP in 2012, 2013 in LLL. These were characterized by L flank and back pain, f/c, aches, little cough without mucous. Then in May '14 had the pain, dyspnea. Could hurt w insp, movement, still happens. Some cough, non productive. She was started on pulmicort in May, seemed to help her breathing some. Albuterol prn also helpful. CT scan in May showed LLL infiltrate with ? Nodular component. Spirometry recently w ? Obstruction.  She was tested for autoimmune processes July > all negative (labs scanned in).    CT 08/02/11 >> LL PNA with some associated mediastinal LAD CT scan 02/26/13 >> unavailable to me; radiology report shows infiltrate dorsal LLL with small irregular nodular opacity 6x97mm, no mediastinal LAD.   ROV 04/08/13 -- returns for followup regarding abnormal CT scan of the chest as above. Since this time have been able to review her CT scan from 02/26/13. There is a left lower lobe nodular infiltrate in a different location compared with her film in 2012. This had a groundglass component. Since her last visit she had another trip to the emergency department and a another CT was performed at [facility], appears to be a CT abdomen and pelvis on 03/30/13. The left lower lobe tree in bud nodularity was again seen.    Review of Systems  Constitutional: Positive for unexpected weight change. Negative for fever.  HENT: Positive for dental problem. Negative for ear pain, nosebleeds, congestion, sore throat, rhinorrhea, sneezing, trouble swallowing, postnasal drip and sinus pressure.   Eyes: Negative for redness and itching.  Respiratory: Positive for chest tightness and shortness of breath. Negative for cough and wheezing.   Cardiovascular: Positive for chest pain. Negative for palpitations and leg swelling.  Gastrointestinal:  Negative for nausea and vomiting.  Genitourinary: Negative for dysuria.  Musculoskeletal: Negative for joint swelling.  Skin: Negative for rash.  Neurological: Positive for headaches.  Hematological: Does not bruise/bleed easily.  Psychiatric/Behavioral: Negative for dysphoric mood. The patient is not nervous/anxious.       Objective:   Physical Exam Filed Vitals:   04/08/13 1042  BP: 110/80  Pulse: 80  Temp: 98.8 F (37.1 C)  TempSrc: Oral  Height: 5\' 6"  (1.676 m)  Weight: 106 lb 6.4 oz (48.263 kg)  SpO2: 99%   Gen: Pleasant, thin, in no distress,  normal affect  ENT: No lesions,  mouth clear,  oropharynx clear, no postnasal drip  Neck: No JVD, no TMG, no carotid bruits  Lungs: No use of accessory muscles, few soft exp squeeks at end-exp on L  Cardiovascular: RRR, heart sounds normal, no murmur or gallops, no peripheral edema  Musculoskeletal: No deformities, no cyanosis or clubbing, mild L intercostal tenderness to palp  Neuro: alert, non focal  Skin: Warm, no lesions or rashes     Assessment & Plan:  Abnormal CT scan, chest CT scan of the abdomen and pelvis performed 8/18 apparently shows persistence of her left lower lobe tree in bud nodularity. We discussed proceeding with bronchoscopy now versus waiting to repeat a CT to facilitate ENB. We have decided to perform standard bronchoscopy now. This will be performed next week  Unspecified asthma(493.90) PFTs do not show overt asthma although she does have a high residual volume.  - Will not start scheduled therapy at  this time - Continue albuterol as needed

## 2013-04-17 ENCOUNTER — Encounter (HOSPITAL_COMMUNITY): Payer: Self-pay | Admitting: Emergency Medicine

## 2013-04-17 LAB — FUNGAL STAIN: Fungal Smear: NONE SEEN

## 2013-04-17 LAB — AFB STAIN: Special Requests: NORMAL

## 2013-04-20 LAB — CULTURE, BAL-QUANTITATIVE W GRAM STAIN
Colony Count: NO GROWTH
Colony Count: NO GROWTH
Culture: NO GROWTH
Culture: NO GROWTH
Special Requests: NORMAL

## 2013-04-21 ENCOUNTER — Telehealth: Payer: Self-pay | Admitting: Emergency Medicine

## 2013-04-21 LAB — POCT URINALYSIS DIPSTICK
Bilirubin, UA: NEGATIVE
Ketones, UA: NEGATIVE
Leukocytes, UA: NEGATIVE
Protein, UA: NEGATIVE
Spec Grav, UA: 1.01
pH, UA: 7

## 2013-04-21 MED ORDER — HYDROCODONE-ACETAMINOPHEN 5-325 MG PO TABS: 1.0000 | ORAL_TABLET | ORAL | Status: DC | PRN

## 2013-04-21 NOTE — Telephone Encounter (Signed)
Spoke with patient, advised that Rx will be sent to verified pharmacy. Patient verbalized understanding and nothing further needed at this time

## 2013-04-21 NOTE — Telephone Encounter (Signed)
Bronch/biopsy results are back -- verified in epic  Called spoke with patient and informed her that her results are back and will send to RB for review.  Pt also stated that the symptoms she had at last ov w/ RB are worse x3 days w/ left back/flank pain, weakness, chills, increased SOB.  Pt does report that she took a home UTI test that was positive and had begun an abx thru her PCP, but when the culture came back negative he had her stop.  Pt would like to know if a UTI could be attributing to her symptoms.  RB please advise, thanks!

## 2013-04-21 NOTE — Telephone Encounter (Signed)
Spoke with the pt  She states that she had mentioned having some pain to RB today, but forgot to ask for pain med She is taking ibuprofen with little relief Please advise, thanks! Allergies  Allergen Reactions  . Penicillins Rash

## 2013-04-21 NOTE — Telephone Encounter (Signed)
Tell her i dont think this pain has anything to do with her lungs  Vicodin 5/325, disp # 15, sig 1 po q4h prn pain, 0 RF

## 2013-04-21 NOTE — Telephone Encounter (Signed)
Reviewed results with her. All bx and smears negative, await cx data. She is having flank discomfort - don't believe that her LLL infiltrates are responsible. ? Whether we need to consider a pulmonary renal syndrome. I may discuss doing serologies with her at our ROV.

## 2013-05-05 ENCOUNTER — Telehealth: Payer: Self-pay | Admitting: Emergency Medicine

## 2013-05-05 NOTE — Telephone Encounter (Signed)
If she is doing well, I think we can cancel the appointment. The cultures are all still negative, but they have to be followed a full 6 weeks to make them final.

## 2013-05-05 NOTE — Telephone Encounter (Signed)
Pt states that Dr. Delton Coombes advised her after the bronch about what he saw and she states she is feeling good so she wants to know does she need to keep her appt on 05-07-13? She was also asking for culture results. Please advise. Carron Curie, CMA

## 2013-05-05 NOTE — Telephone Encounter (Signed)
[email or web address].Jennifer Castillo, CMA  

## 2013-05-05 NOTE — Telephone Encounter (Signed)
Called, spoke with pt.  Informed her of below per RB.  She verbalized understanding of this and would like to proceed with cancelling appt on Sept 25.  This has been done.  Pt is aware and is to call back if anything further is needed.

## 2013-05-07 ENCOUNTER — Telehealth: Payer: Self-pay | Admitting: Emergency Medicine

## 2013-05-07 ENCOUNTER — Ambulatory Visit: Payer: BC Managed Care – PPO | Admitting: Emergency Medicine

## 2013-05-07 LAB — COMPLETE METABOLIC PANEL WITH GFR
AST: 14 U/L
Albumin: 4.5
BUN/Creatinine Ratio: 13
CO2: 21 mmol/L
Chloride: 103 mmol/L
GFR, Est African American: 137
Phosphatidylserine IgA Autoantibodies: 47
Potassium: 4.2 mmol/L
Sodium: 139 mmol/L (ref 137–147)
Total Bilirubin: 1 mg/dL

## 2013-05-07 LAB — CHG IAAD IA HIV-1 AG W/HIV-1 & HIV-2 ANTBDY SINGLE
HIV 1/O/2 Abs, Qual: 2
HIV 1/O/2 Abs-Index Value: <1

## 2013-05-07 LAB — ANEMIA PROFILE B
Basophils Absolute: 0 /uL
Basophils Absolute: 0 /uL
Eosinophils Absolute: 0 /uL
Folate: >20
HCT: 40 %
Hemoglobin: 13.8 g/dL (ref 11.8–15.5)
Iron Saturation: 43
Iron: 119
Lymphocytes relative %: 23 % (ref 15–45)
Lymphs Abs: 1
MCH: 32.3
MCHC: 34.8
Monocytes(Absolute): 0.3
Neutrophils absolute (GR#): 2.9 10*3/uL (ref 1.7–7.8)
RDW: 13.6
UIBC: 159

## 2013-05-07 LAB — TSH: CRP: 0.6

## 2013-05-07 LAB — LP+NON-HDL CHOLESTEROL
Cholesterol, Total: 167
LDL (calc): 84
Triglycerides: 46

## 2013-05-07 LAB — D-DIMER, QUANTITATIVE: D-Dimer, Quant: <200

## 2013-05-07 NOTE — Telephone Encounter (Signed)
LMTCB

## 2013-05-08 NOTE — Telephone Encounter (Signed)
Discussed her sx with her. Don't believe these are pneumonia, could relate to her Ct findings if they reflect chronic smoldering inflammation. If she continues to have sx will check a CXR for reassurance. Waiting for the AFB/fungal cx. If negative, I'll repeat her Ct scan and consider referral for VATS bx.

## 2013-05-08 NOTE — Telephone Encounter (Signed)
I spoke with pt. She c/o left lower back pain, HA, chills but no fever, some slight abdominal pain x Thursday night. She stated kind of feels like when she had PNA except no fever. No available openings today. Please advise RB thanks  Allergies  Allergen Reactions  . Penicillins Rash

## 2013-05-12 ENCOUNTER — Encounter: Payer: Self-pay | Admitting: Emergency Medicine

## 2013-05-12 ENCOUNTER — Ambulatory Visit (INDEPENDENT_AMBULATORY_CARE_PROVIDER_SITE_OTHER): Payer: BC Managed Care – PPO | Admitting: Nurse Practitioner

## 2013-05-12 ENCOUNTER — Encounter: Payer: Self-pay | Admitting: Nurse Practitioner

## 2013-05-12 VITALS — BP 112/74 | HR 97 | Temp 97.8°F | Ht 65.17 in | Wt 100.0 lb

## 2013-05-12 DIAGNOSIS — R198 Other specified symptoms and signs involving the digestive system and abdomen: Secondary | ICD-10-CM

## 2013-05-12 DIAGNOSIS — M549 Dorsalgia, unspecified: Secondary | ICD-10-CM

## 2013-05-12 LAB — VITAMIN D 25 HYDROXY (VIT D DEFICIENCY, FRACTURES): Vit D, 25-Hydroxy: 51.8

## 2013-05-12 LAB — URINALYSIS, DIPSTICK ONLY
Nitrites, Initial: NEGATIVE
Protein, Ur: NEGATIVE
Specific Gravity, Urine: 1.015
WBC, UA: NEGATIVE
pH, Urine: 6

## 2013-05-12 LAB — CULTURE, URINE COMPREHENSIVE
Eosinophils Absolute: 0 /uL
Eosinophils Absolute: 0 /uL
HCT: 41 %
Hemoglobin: 14.3 g/dL (ref 11.8–15.5)
Lymphocytes Relative: 4 % — AB (ref 18–52)
MCHC: 31.8
Monocytes relative %: 14 % — AB (ref 2–10)
Neutrophils absolute (GR#): 0 10*3/uL — AB (ref 1.7–7.8)
RDW: 34.6

## 2013-05-12 MED ORDER — OMEPRAZOLE 40 MG PO CPDR: 40.0000 mg | DELAYED_RELEASE_CAPSULE | Freq: Every day | ORAL | Status: DC

## 2013-05-12 NOTE — Patient Instructions (Signed)
I do not know the etiology of your symptoms, but my suspicions include autoimmune disease or infectious origin. I will research records from your PCP, urologist, gynecologist, & pulmonologist and come up with a plan. There may be additional tests I want to run, or I may send you to a rheumatologist.  I wonder if taking ibuprophen has caused some gastritis and is causing your current abdominal symptoms. Please stop using ibuprophen, advil, motrin, goody's powders, or aspirin products. Use tylenol for headache. Start omeprazole, take daily. If symptoms are related to gastritis you should get relief within 2-4 weeks. Please take med for 4 weeks.  I will call you with urine results and with a future plan. Great to meet you!

## 2013-05-12 NOTE — Progress Notes (Signed)
Subjective:     Jessica Osborne is a 37 y.o. female who presents with L sided flank pain that radiates around to abdomen, described as burning. This has been intermittent for 5 months. Associated symptoms include headache, fatigue, SOB, chills, and occasional mild night sweats. She has had extensive work up with historical PCP, gynecology, pulmonology, urology, and ED visit at [facility] including abdominal CT. Chest CT showed pneumonia and nodule in LLL; she has AFB & fungal cultures from bronchial washings pending. She has been on multiple antibiotics in recent months. Additionally she c/o unintentional weight loss (although she has made diet changes)- 15 pounds in 4 months and abdominal fullness after eating or drinking which may be associated with gastritis secondary to frequent ibuprophen use-she has been taking 2 T nearly nightly for HA. She has no prior history of frequent HA before onset of illness in May. HA is described as R-sided w/blurred vision at times in R eye. Possibly r/t present illness is Hx of recurrent rash on chest & neck and hip pain (vasculitis? inflammatory joint pain?). She denies Hx of known tick bite. She has a family Hx of SLE, RA, & DM (mother).   Chief Complaint: flank pain Additional Complaints: Fatigue Patient complains of fatigue. Symptoms began 5 months ago. The patient feels the fatigue began with: chills, flank pain, SOB, HA. Symptoms of her fatigue have been general malaise and headaches. Patient describes the following psychological symptoms: mild anxiety r/t present illness.. Patient denies change in hair texture, cold intolerance, constipation, excessive menstrual bleeding and GI blood loss. Symptoms have been intermittent. Symptom severity: mild. Previous visits for this problem: 2-3 visits to historical PCP, urologist, pulmonologist, gynecologist, ed visit..   Rash: pustular & papular rash neck & chest Diarrhea Patient complains of diarrhea. Onset of diarrhea was a few  months ago. Diarrhea is occurring approximately 1-2 times per day. Patient describes diarrhea as loose. Diarrhea has been associated with weight loss of 15 lbs since May, 2014 and abdominal fullness after drinking or eating. Patient denies blood in stool, illness in household contacts, recent camping, recent travel. Previous visits for diarrhea: none. Evaluation to date: none. Treatment to date: none. Hip Pain Patient complains of bilateral hip pain. Onset of the symptoms was several years ago. Inciting event: none. The patient reports the hip pain is aggravated by walking. Associated symptoms: none. Patient has had no prior hip problems. Previous visits for this problem: none. Evaluation to date: none. Treatment to date: none. Pneumonia Patient complains of treated for pneumonia 3 times in 3 years, recent onset of dry cough, SOB, pulmonary nodule LLL.  Symptoms began 5 months ago, and have been intermittent since that time. Patient denies nausea and vomiting. Treatment thus far includes multiple antibiotics including levaquin, azithromycin, and 2 others. Past pulmonary history is significant for pneumonia. Neuro: R-sided Ha for 5 months, blurred vision R eye. mild-moderate pain, 400 mg ibuprophen decreases pain, but does not relieve it entirely.   The following portions of the patient's history were reviewed and updated as appropriate: allergies, current medications, past family history, past medical history, past social history, past surgical history and problem list.  Review of Systems Constitutional: negative for positive for weight loss, fatigue, night sweats, anorexia Eyes: positive for visual disturbance and blurred vision R eye w/HA Ears, nose, mouth, throat, and face: positive for hoarseness Respiratory: positive for cough, pleurisy/chest pain, pneumonia and nodule on Chest CT LLL Cardiovascular: negative for chest pain, chest pressure/discomfort, irregular heart beat, lower  extremity edema  and near-syncope Gastrointestinal: positive for diarrhea and abdominal fullness Genitourinary:positive for bilateral cva tenderness, L flank pain Integument/breast: positive for rash Musculoskeletal:positive for hip pain Neurological: positive for headaches Behavioral/Psych: positive for sleep disturbance, negative for depression, excessive alcohol consumption, illegal drug usage and tobacco use    Objective:    BP 112/74  Pulse 97  Temp(Src) 97.8 F (36.6 C) (Oral)  Ht 5' 5.17" (1.655 m)  Wt 100 lb (45.36 kg)  BMI 16.56 kg/m2  SpO2 98%  LMP 05/12/2010 BP 112/74  Pulse 97  Temp(Src) 97.8 F (36.6 C) (Oral)  Ht 5' 5.17" (1.655 m)  Wt 100 lb (45.36 kg)  BMI 16.56 kg/m2  SpO2 98%  LMP 05/12/2010 General appearance: alert, cooperative, appears stated age and no distress Head: Normocephalic, without obvious abnormality, atraumatic Eyes: negative findings: lids and lashes normal, conjunctivae and sclerae normal, corneas clear and pupils equal, round, reactive to light and accomodation Ears: normal TM's and external ear canals both ears Nose: Nares normal. Septum midline. Mucosa normal. No drainage or sinus tenderness., scant discharge Throat: lips, mucosa, and tongue normal; teeth and gums normal Neck: no adenopathy, no carotid bruit, supple, symmetrical, trachea midline and thyroid not enlarged, symmetric, no tenderness/mass/nodules Back: no kyphosis present, no scoliosis present, no tenderness to percussion or palpation, range of motion normal, symmetric, no curvature. ROM normal. No CVA tenderness., . Lungs: clear to auscultation bilaterally Heart: regular rate and rhythm, S1, S2 normal, no murmur, click, rub or gallop Abdomen: very thin, suprapubic tenderness Extremities: extremities normal, atraumatic, no cyanosis or edema and cool feet Pulses: 2+ and symmetric Skin: Skin color, texture, turgor normal. No rashes or lesions or pt thinks she looks yellow, subjective, but does  not look yellow to me. Lymph nodes: no axillary or supreclavicular nodes. Has plapable L anterior cervical nodes, NT, moveable Neurologic: Alert and oriented X 3, normal strength and tone. Normal symmetric reflexes. Normal coordination and gait    Assessment:     1. CVA tenderness   2. Abdominal fullness    suprapubic tenderness. Fullness may be r/t ibuprophen use.    Plan:     will request records from historical PCP, & specialist, [facility] ED.  Check urine today due to bilat CVA tenderness. Suspect, illness is r/t AI disease or infectious etiology: EBV? Lyme? CMV? Will consider rheumatology and/or ID consults.

## 2013-05-13 LAB — URINALYSIS, MICROSCOPIC ONLY: Bacteria, UA: NONE SEEN

## 2013-05-14 LAB — URINE CULTURE: Colony Count: 5000

## 2013-05-15 ENCOUNTER — Telehealth: Payer: Self-pay | Admitting: Nurse Practitioner

## 2013-05-15 LAB — FUNGUS CULTURE W SMEAR
Fungal Smear: NONE SEEN
Special Requests: NORMAL

## 2013-05-15 NOTE — Telephone Encounter (Signed)
No growth in urine. Pt has [place] L kidney pending next week. She continues to have L sided dull pain W occasional burning & feeling of early satiety, even w/drinking glass of water. Has not started omeprazole yet. Pt [place] remote histoplas infection? Will consider ordering histoplasmosis ab screen if not done already.

## 2013-05-21 ENCOUNTER — Ambulatory Visit (INDEPENDENT_AMBULATORY_CARE_PROVIDER_SITE_OTHER): Payer: BC Managed Care – PPO | Admitting: Nurse Practitioner

## 2013-05-21 ENCOUNTER — Encounter: Payer: Self-pay | Admitting: Nurse Practitioner

## 2013-05-21 VITALS — BP 121/78 | HR 98 | Temp 98.6°F | Resp 16 | Ht 65.0 in | Wt 101.0 lb

## 2013-05-21 DIAGNOSIS — R109 Unspecified abdominal pain: Secondary | ICD-10-CM

## 2013-05-21 DIAGNOSIS — G8929 Other chronic pain: Secondary | ICD-10-CM

## 2013-05-21 DIAGNOSIS — J329 Chronic sinusitis, unspecified: Secondary | ICD-10-CM

## 2013-05-21 DIAGNOSIS — Z23 Encounter for immunization: Secondary | ICD-10-CM

## 2013-05-21 MED ORDER — DOXYCYCLINE HYCLATE 100 MG PO TABS: 100.0000 mg | ORAL_TABLET | Freq: Two times a day (BID) | ORAL | Status: DC

## 2013-05-21 NOTE — Patient Instructions (Signed)
Start doxycycline. Start neil med sinus washes daily for 5-7 days. Use mechanical method to help ear drain into sinuses. I will refer to neurology if headaches not better. Continue to abstain from ibuprophen. Continue omeprazole. Let's see you in 4 weeks or sooner if you need [place].  Sinusitis Sinusitis is redness, soreness, and swelling (inflammation) of the paranasal sinuses. Paranasal sinuses are air pockets within the bones of your face (beneath the eyes, the middle of the forehead, or above the eyes). In healthy paranasal sinuses, mucus is able to drain out, and air is able to circulate through them by way of your nose. However, when your paranasal sinuses are inflamed, mucus and air can become trapped. This can allow bacteria and other germs to grow and cause infection. Sinusitis can develop quickly and last only a short time (acute) or continue over a long period (chronic). Sinusitis that lasts for more than 12 weeks is considered chronic.  CAUSES  Causes of sinusitis include:  Allergies.  Structural abnormalities, such as displacement of the cartilage that separates your nostrils (deviated septum), which can decrease the air flow through your nose and sinuses and affect sinus drainage.  Functional abnormalities, such as when the small hairs (cilia) that line your sinuses and help remove mucus do not work properly or are not present. SYMPTOMS  Symptoms of acute and chronic sinusitis are the same. The primary symptoms are pain and pressure around the affected sinuses. Other symptoms include:  Upper toothache.  Earache.  Headache.  Bad breath.  Decreased sense of smell and taste.  A cough, which worsens when you are lying flat.  Fatigue.  Fever.  Thick drainage from your nose, which often is green and may contain pus (purulent).  Swelling and warmth over the affected sinuses. DIAGNOSIS  Your caregiver will perform a physical exam. During the exam, your caregiver may:  Look in  your nose for signs of abnormal growths in your nostrils (nasal polyps).  Tap over the affected sinus to check for signs of infection.  View the inside of your sinuses (endoscopy) with a special imaging device with a light attached (endoscope), which is inserted into your sinuses. If your caregiver suspects that you have chronic sinusitis, one or more of the following tests may be recommended:  Allergy tests.  Nasal culture A sample of mucus is taken from your nose and sent to a lab and screened for bacteria.  Nasal cytology A sample of mucus is taken from your nose and examined by your caregiver to determine if your sinusitis is related to an allergy. TREATMENT  Most cases of acute sinusitis are related to a viral infection and will resolve on their own within 10 days. Sometimes medicines are prescribed to help relieve symptoms (pain medicine, decongestants, nasal steroid sprays, or saline sprays).  However, for sinusitis related to a bacterial infection, your caregiver will prescribe antibiotic medicines. These are medicines that will help kill the bacteria causing the infection.  Rarely, sinusitis is caused by a fungal infection. In theses cases, your caregiver will prescribe antifungal medicine. For some cases of chronic sinusitis, surgery is needed. Generally, these are cases in which sinusitis recurs more than 3 times per year, despite other treatments. HOME CARE INSTRUCTIONS   Drink plenty of water. Water helps thin the mucus so your sinuses can drain more easily.  Use a humidifier.  Inhale steam 3 to 4 times a day (for example, sit in the bathroom with the shower running).  Apply a warm,  moist washcloth to your face 3 to 4 times a day, or as directed by your caregiver.  Use saline nasal sprays to help moisten and clean your sinuses.  Take over-the-counter or prescription medicines for pain, discomfort, or fever only as directed by your caregiver. SEEK IMMEDIATE MEDICAL CARE  IF:  You have increasing pain or severe headaches.  You have nausea, vomiting, or drowsiness.  You have swelling around your face.  You have vision problems.  You have a stiff neck.  You have difficulty breathing. MAKE SURE YOU:   Understand these instructions.  Will watch your condition.  Will get help right away if you are not doing well or get worse. Document Released: 07/30/2005 Document Revised: 10/22/2011 Document Reviewed: 08/14/2011 [facility] Patient Information 2014 [place], [place].

## 2013-05-22 NOTE — Progress Notes (Signed)
Subjective:     Jessica Osborne is a 37 y.o. female who presents for evaluation of R sided headache behind eye (of note, pt had recent eye exam & was told she may have early signs of glaucoma in R eye) and purulent sinus drainage. HA has been a problem for several months partially relieved by ibuprophen. Sinus drainage is new times 1 day. Past history is significant for asthma, multiple episodes of pneumonia (recent & remote-pt remembers being treated for pna when she was 37 yo.), chest CT findings of LLL nodule in 2012 (w/hilar LAD) & 2014 (hilar LAD resolved but new axillary shotty nodes ), recent bronchoscopy w/washings-no culture growth at 4 weeks.  Additionally, pt c/o ongoing L flank pain-urology w/u -no associated findings. Pt was treated for UTI twice: May 2014 Urine culture grew beta hemolytic strep, then in July, klebsiella. I repeated urine culture in Sept. when pt presented w/ L flank pain- no growth. Urologist mentions pain may be associated w/post infection inflammation. Alternatively, I wonder if it may be MSK in nature.  Pt has fam Hx of AI disease (mother has RA). Historical provider ordered RNP, DS DNA ab, SSa & b, RA, & spep-all negative. Should consider Wegner's granulomatosis given freq UTI, sinus & lung issues or vasculitide (TA) given chronic unilat HA at temple.  The following portions of the patient's history were reviewed and updated as appropriate: allergies, current medications, past family history, past medical history, past social history, past surgical history and problem list.  Review of Systems Constitutional: negative for anorexia, chills, fatigue, fevers, night sweats and weight loss, gained 1 pound since last visit, but still 12 pounds lighter than she was 3 mos ago. 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 Ears, nose, mouth, throat, and face: positive for purulent sinus drainage  , negative for hoarseness, sore throat and voice change Respiratory: positive for asthma, dyspnea on exertion and pneumonia, negative for present complaints Cardiovascular: negative for chest pain, chest pressure/discomfort, irregular heart beat, lower extremity edema and near-syncope Gastrointestinal: improvement in early satiety since starting omeprazole Genitourinary:negative for dysuria, frequency, hematuria and hesitancy Integument/breast: positive for chest & neck acne Musculoskeletal:positive for back pain, L posterior thoracic pain radiates to L side Neurological: positive for headaches, negative for coordination problems, dizziness, gait problems, seizures, tremors and weakness Behavioral/Psych: positive for anxiety, related to recent health problems, pt worries that she has ca. She is trying to use clonazepam less to help w/sleep.   Objective:    BP 121/78  Pulse 98  Temp(Src) 98.6 F (37 C) (Oral)  Resp 16  Ht 5\' 5"  (1.651 m)  Wt 101 lb (45.813 kg)  BMI 16.81 kg/m2  SpO2 99%  LMP 05/12/2010 General appearance: alert, cooperative, appears stated age, no distress and very thin Eyes: negative findings: lids and lashes normal, conjunctivae and sclerae normal, corneas clear and pupils equal, round, reactive to light and accomodation Ears: retracted R TM, pops out when pt holds nose & blows. no fluid level or bubbles appreciated. Nose: clear discharge bilat Throat: lips, mucosa, and tongue normal; teeth and gums normal Neck: no carotid bruit, supple, symmetrical, trachea midline, thyroid not enlarged, symmetric, no tenderness/mass/nodules and palpable ant. bilat. cervical nodes, thin body habitus Back: no kyphosis present, no scoliosis present, no skin lesions, erythema, or scars, symmetric, no curvature. ROM normal. No CVA tenderness., unable to touch toes. Able to reproduce tender area just  under ribs to L of spine. Lungs: clear to auscultation bilaterally Heart: regular rate and  rhythm, S1, S2 normal, no murmur, click, rub or gallop Extremities: extremities normal, atraumatic, no cyanosis or edema Skin: chest & upper back acne Lymph nodes: see neck    Assessment:    1 Acute bacterial sinusitis w/retracted R TM. R- sided frontal HA behind eye & temple times several mos. New pururlent sinus drainage. 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 NF:[id number] arteritis, mass, optic nerve irritaion 2 Early satiety, getting relief since started omeprazole & cut back ibuprophen 3 Chronic L flank pain. 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 DD costochondritis, muscle strain, arthritis.    Plan:   1 doxy & daily sinus rinses, mechanical techniques to facilitate eust tube function. If no imprvmt in HA & drainage, will refer to ENT, poss neuro given recent eye exam findings. Will consider Vasculitis w/u-includes wegner's & Churg-strauss (ESR, RF ELISA, antiphospholipid ab, cryoglobulins, PANCA, IgE, sIL-2R. urine w/micro had no RBC casts 9/14, alk phos & ca have been nml)  & sarcoid (ACE, gammaglobulins) 2 Continue omeprazole for 4 full weeks, then re-eval, Continue to cut back on ibuprophen 3 Refer to PT. Get back xrays if no improvement (want to minimize radiation).

## 2013-05-25 ENCOUNTER — Encounter: Payer: Self-pay | Admitting: *Deleted

## 2013-05-25 ENCOUNTER — Telehealth: Payer: Self-pay | Admitting: Emergency Medicine

## 2013-05-25 DIAGNOSIS — R519 Headache, unspecified: Secondary | ICD-10-CM

## 2013-05-25 NOTE — Telephone Encounter (Signed)
Will refer to neuro as pt is having r -sided HA w/ vision change. Had eye exam last week, was told she may have early glaucoma in r eye. Treated for sinusitis, pt has had no relief.

## 2013-05-25 NOTE — Telephone Encounter (Signed)
Patient notified

## 2013-05-25 NOTE — Telephone Encounter (Signed)
Patient states she is still having headaches and pressure behind her eye, discussed with Layne possible Neurology referral and she would like to have one, please advise patient.

## 2013-05-29 ENCOUNTER — Telehealth: Payer: Self-pay | Admitting: Emergency Medicine

## 2013-05-29 LAB — AFB CULTURE WITH SMEAR (NOT AT ARMC)
Acid Fast Smear: NONE SEEN
Acid Fast Smear: NONE SEEN
Special Requests: NORMAL

## 2013-05-29 NOTE — Telephone Encounter (Signed)
I spoke with pt. She stated her insurance sent her a letter stating they would not cover the bronchoscopy. She has already had this done. She wants to know if this is not ran through her insurance. Please advise PCC's thanks

## 2013-06-01 NOTE — Telephone Encounter (Signed)
Pt aware that8/27/14 a precert was requested from [facility] and pat c states no precert was required Tobe Sos

## 2013-06-02 ENCOUNTER — Ambulatory Visit (INDEPENDENT_AMBULATORY_CARE_PROVIDER_SITE_OTHER): Payer: BC Managed Care – PPO | Admitting: Neurology

## 2013-06-02 ENCOUNTER — Encounter: Payer: Self-pay | Admitting: Neurology

## 2013-06-02 VITALS — BP 112/68 | HR 80 | Temp 98.2°F | Resp 12 | Ht 66.0 in | Wt 100.0 lb

## 2013-06-02 DIAGNOSIS — R519 Headache, unspecified: Secondary | ICD-10-CM

## 2013-06-02 DIAGNOSIS — R51 Headache: Secondary | ICD-10-CM

## 2013-06-02 DIAGNOSIS — H5711 Ocular pain, right eye: Secondary | ICD-10-CM

## 2013-06-02 DIAGNOSIS — G43819 Other migraine, intractable, without status migrainosus: Secondary | ICD-10-CM

## 2013-06-02 DIAGNOSIS — G43119 Migraine with aura, intractable, without status migrainosus: Secondary | ICD-10-CM

## 2013-06-02 DIAGNOSIS — H5710 Ocular pain, unspecified eye: Secondary | ICD-10-CM

## 2013-06-02 MED ORDER — AMITRIPTYLINE HCL 10 MG PO TABS: 10.0000 mg | ORAL_TABLET | Freq: Every day | ORAL | Status: DC

## 2013-06-02 MED ORDER — PREDNISONE 10 MG PO TABS: ORAL_TABLET | ORAL | Status: DC

## 2013-06-02 NOTE — Progress Notes (Signed)
NEUROLOGY CONSULTATION NOTE  Jessica Osborne MRN: [id number] DOB: 14-Oct-1975  Referring provider: Maximino Sarin, NP Primary care provider: Maximino Sarin, NP  Reason for consult:  headache  HISTORY OF PRESENT ILLNESS: Jessica Osborne is a 37 year old right-handed woman with history of recently discovered lung nodule and history of pneumonia who presents with new-onset headache.  Records and images were personally reviewed where available.    Symptoms started about 4 months ago.  She initially developed right-sided dull pressure-like head pain, about 3-4/10.  It lasts about and occurs 4-5x/day.  Over the past 3 weeks, she developed a constant right retro-orbital stabbing pain, 6/10, which feels like someone poking the back of her eye.  She also notes pressure over her right maxilla.  Headache is not positional.  She wakes up with it and it gradually gets worse as the day progresses.  It is associated with mild monocular blurred vision.  There is eye discomfort when she moves her eye.  She notes that her right pupil looks larger.  She notes mild lacrimation but no nasal congestion, right-sided facial edema or right facial hyperhidrosis.  She denies vertigo, facial numbness or dysphagia.  There is mild photophobia but no nausea or phonophobia.  Stress makes it worse.  Tylenol and ibuprofen helps.  She takes it every night with half a Klonopin to help her sleep.  She has never been on a preventative medication.  She denies personal history of migraines but notes that her brother has migraines.  She rarely drinks caffeine or alcohol.  She sleeps well as long as she takes the Klonopin.  She perceived right eye blurriness and saw an eye doctor a couple of weeks ago, who told her she may have early signs of glaucoma.  Around the same time, she developed left-sided burning chest pain.  CT of chest revealed lung nodule.  She was treated for possible pneumonia.  PAST MEDICAL HISTORY: Past Medical History   Diagnosis Date  . Chicken pox   . Urinary tract bacterial infections   . Miscarriage     PAST SURGICAL HISTORY: Past Surgical History  Procedure Laterality Date  . Tonsillectomy    . Video bronchoscopy Bilateral 04/16/2013    Procedure: VIDEO BRONCHOSCOPY WITH FLUORO;  Surgeon: Leslye Peer, MD;  Location: [facility];  Service: Cardiopulmonary;  Laterality: Bilateral;    MEDICATIONS: Current Outpatient Prescriptions on File Prior to Visit  Medication Sig Dispense Refill  . albuterol (PROVENTIL HFA;VENTOLIN HFA) 108 (90 BASE) MCG/ACT inhaler Inhale 2 puffs into the lungs every 6 (six) hours as needed for wheezing.      . cholecalciferol (VITAMIN D) 1000 UNITS tablet Take 1,000 Units by mouth daily.      . clonazePAM (KLONOPIN) 0.5 MG tablet Take 0.5 mg by mouth daily.       Marland Kitchen levonorgestrel (MIRENA) 20 MCG/24HR IUD 1 each by Intrauterine route once.      . Multiple Vitamins-Minerals (MULTIVITAMIN PO) Take by mouth daily.      . Omega-3 Fatty Acids (FISH OIL PO) Take by mouth daily.      Marland Kitchen omeprazole (PRILOSEC) 40 MG capsule Take 1 capsule (40 mg total) by mouth daily.  30 capsule  1   No current facility-administered medications on file prior to visit.    ALLERGIES: Allergies  Allergen Reactions  . Penicillins Rash    FAMILY HISTORY: Family History  Problem Relation Age of Onset  . Melanoma Maternal Aunt   . Asthma Brother   .  Allergies Brother   . Rheum arthritis Mother   . Arthritis Mother     rheumatoid  . Lupus Mother   . Diabetes Mother   . Stroke Maternal Grandfather   . Alcohol abuse Paternal Grandfather   . Heart disease Paternal Grandfather   . Heart disease Maternal Grandmother   . Alcohol abuse Paternal Grandmother     SOCIAL HISTORY: History   Social History  . Marital Status: Married    Spouse Name: N/A    Number of Children: 2  . 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     Social History Main  Topics  . Smoking status: Never Smoker   . Smokeless tobacco: Never Used  . Alcohol Use: Yes     Comment: Occ  . Drug Use: No  . Sexual Activity: Yes    Birth Control/ Protection: IUD   Other Topics Concern  . Not on file   Social History Narrative   [name] is married, lives with her husband & 2 young children. She works in the home.     REVIEW OF SYSTEMS: Constitutional: No fevers, chills, or sweats, no generalized fatigue, change in appetite Eyes: No visual changes, double vision, eye pain Ear, nose and throat: No hearing loss, ear pain, nasal congestion, sore throat Cardiovascular: No chest pain, palpitations Respiratory:  No shortness of breath at rest or with exertion, wheezes GastrointestinaI: No nausea, vomiting, diarrhea, abdominal pain, fecal incontinence Genitourinary:  No dysuria, urinary retention or frequency Musculoskeletal:  No neck pain, back pain Integumentary: No rash, pruritus, skin lesions Neurological: as above Psychiatric: No depression, insomnia, anxiety Endocrine: No palpitations, fatigue, diaphoresis, mood swings, change in appetite, change in weight, increased thirst Hematologic/Lymphatic:  No anemia, purpura, petechiae. Allergic/Immunologic: no itchy/runny eyes, nasal congestion, recent allergic reactions, rashes  PHYSICAL EXAM: Filed Vitals:   06/02/13 1359  BP: 112/68  Pulse: 80  Temp: 98.2 F (36.8 C)  Resp: 12   General: No acute distress Head:  Normocephalic/atraumatic, tenderness to palpation of right TMJ Neck: supple, no paraspinal tenderness, full range of motion Back: No paraspinal tenderness Heart: regular rate and rhythm Lungs: Clear to auscultation bilaterally. Vascular: No carotid bruits. Neurological Exam: Mental status: alert and oriented to person, place, and time, speech fluent and not dysarthric, language intact. Cranial nerves: CN I: not tested CN II: pupils equal, round and reactive to light, visual fields intact,  fundi unremarkable. CN III, IV, VI:  full range of motion, no nystagmus, no ptosis CN V: facial sensation intact CN VII: upper and lower face symmetric CN VIII: hearing intact CN IX, X: gag intact, uvula midline CN XI: sternocleidomastoid and trapezius muscles intact CN XII: tongue midline Bulk & Tone: normal, no fasciculations. Motor: 5/5 throughout Sensation: temperature and vibration intact Deep Tendon Reflexes: 2+ throughout, toes down Finger to nose testing: normal. Gait: normal stance and stride.  Able to walk on toes, heels and in tandem. Romberg negative.  IMPRESSION: Most likely a migraine variant.  Another possibility is hemicrania continua, although she really lacks the autonomic symptoms typically seen.  No asymmetry of pupil size appreciated.  PLAN: 1.  Start amitriptyline 10mg  qhs.  Side effects discussed.  Call in one month with update. 2.  Prednisone taper to break daily headache.  No NSAIDs while on taper. 3.  Not to take Tylenol or ibuprofen more than 2 days out of week to prevent rebound. 4.  Stress reduction. 5.  MRI brain given that this is a sudden onset new constant headache. 5.  Follow up in 3 months  Thank you for allowing me to take part in the care of this patient.  Shon Millet, DO  CC:  Maximino Sarin, NP

## 2013-06-02 NOTE — Patient Instructions (Addendum)
Most likely a migraine variant, exacerbated by stress and jaw clenching 1.  We will start amitriptyline 10mg  at bedtime.  Side effects include sleepiness and dizziness.  There is a disclaimer on all antidepressants of possible increased suicidal ideation, but this is very rare.  Call in one month with update. 2.  I will give you 7 day prednisone taper to break cycle of daily headaches.  Take 6 pills on day 1, then 5 pills on day 2, then 4 pills on day 3, then 3 pills on day 4, then 2 pills on day 5, then 1 pill on day 6, then 1/2 pill on day 7, then stop.  Do not take ibuprofen or other NSAIDs while on the prednisone. 3.  Use tylenol and ibuprofen only when the pain is really intolerable.  You should not take pain meds more than 2 days out of the week to prevent rebound headache. 4.  Drink plenty of fluids. 5.  We will get MRI of brain since these are new headaches. 6.  Follow up in 3 months.  Your MRI is scheduled at [facility] located at [address] in [place] on Friday, October 31st at 6 :15 pm . Please arrive 15 minutes prior to your appointment time.      [phone].

## 2013-06-03 ENCOUNTER — Telehealth: Payer: Self-pay | Admitting: Neurology

## 2013-06-03 DIAGNOSIS — R519 Headache, unspecified: Secondary | ICD-10-CM

## 2013-06-03 NOTE — Telephone Encounter (Signed)
Spoke with the patient. Wanting to reschedule the MRI brain that I scheduled yesterday at [facility] to another date and time as well as to their location in [place]. I asked that she call the phone number on her AVS to accomplish this. I also told her to call me if she needed me to do something else to assist her in this process. She states she will.

## 2013-06-04 ENCOUNTER — Telehealth: Payer: Self-pay | Admitting: Nurse Practitioner

## 2013-06-04 NOTE — Telephone Encounter (Signed)
Pt still having back & rib pain. She can think of no aggravating or relieving factors. She mentions a knot that she can feel in her back-I did not appreciate knot at last exam, although she showed me where she thought it was. I am not in favor of more radiation exposure.  I encouraged her to start prednisone dose pack as ordered by neuro for HA. I asked her to monitor back pain while on prednisone as it may help make a diagnosis-if it gets better, likely the pain is MSK in nature. Suggested she proceed w/PT as ordered before we consider imaging of spine. She has had 4 CT scans in the last 2 years. Pt is to CB after taking pred dose pack.

## 2013-06-04 NOTE — Telephone Encounter (Signed)
Please advise 

## 2013-06-04 NOTE — Telephone Encounter (Signed)
3 pm [address] 16/10  Pt was notified and will call Kathryne Sharper if the appt is not convenient for her

## 2013-06-04 NOTE — Telephone Encounter (Signed)
Patient is going for MRI today of her head and she was wondering if something could be added on for the left sided pain she is having in her back and ribs, patients appt is at 3:30 in [place]

## 2013-06-04 NOTE — Telephone Encounter (Signed)
Patient is requesting a call back from you concerning short term prednisone that the Neurosurgeon gave her at her visit for headaches.patient wants to know what you think about her taking this. Please advise?

## 2013-06-09 ENCOUNTER — Telehealth: Payer: Self-pay | Admitting: Emergency Medicine

## 2013-06-09 ENCOUNTER — Telehealth: Payer: Self-pay | Admitting: Nurse Practitioner

## 2013-06-09 ENCOUNTER — Telehealth: Payer: Self-pay | Admitting: Neurology

## 2013-06-09 DIAGNOSIS — R079 Chest pain, unspecified: Secondary | ICD-10-CM

## 2013-06-09 DIAGNOSIS — R9389 Abnormal findings on diagnostic imaging of other specified body structures: Secondary | ICD-10-CM

## 2013-06-09 NOTE — Telephone Encounter (Signed)
Patient is asking if she should come in tomorrow morning for blood work. She is not feeling any better.

## 2013-06-09 NOTE — Telephone Encounter (Signed)
There is no reason to believe that the bronchoscopy or biopsies would explain her symptoms. We should do a CXR now to confirm no late-onset changes due to procedure, by this is highly unlikely. Please order a CXR for her.   The second issue is that her biopsies and cx's are all negative, so her original LLL CT scan abnormalities are still unexplained.  I believe she needs a repeat CT scan chest before the end of year. If the area of apparent inflammation persists then I will discuss possible surgical biopsy with her. We can arrange for the CT scan by phone, or she can come to see me to discuss.   Thanks

## 2013-06-09 NOTE — Telephone Encounter (Signed)
Pt calling to see if MRI brain would include her sinuses. Please cal [phone] / Oneita Kras,

## 2013-06-09 NOTE — Telephone Encounter (Signed)
Pt states that since Bronch having increased lung pain and chest pain-worse since Bx, HA and increased blurriness in right eye x 3 weeks.   Pt has seen multiple phys to rule out symptoms she has been having: Urologist-kidneys fine Neurologist- could not find anything that could be related  Optometrist- eyes are okay--slight Glaucoma in right eye (abn for her age)  RB please advise if possibly related to procedure. Thanks.

## 2013-06-09 NOTE — Telephone Encounter (Signed)
Spoke with [employer]. She was wondering about the MRI she had and if anything showed up in her sinus area. She reports that she did talk to Dr. Everlena Cooper on the phone and he did not make mention of anything outstanding in the sinus area. She does still have the right eye pain and pressure on the right.  I told her I felt sure he would have mentioned any abnormalities if they were significant. She has a f/u in 3 weeks and will bring the CD for him to review. I told her that I would let him know. **Dr. Everlena Cooper, just a FYI.

## 2013-06-09 NOTE — Telephone Encounter (Signed)
Pt scheduled 06/12/13 at 245 to come in and discuss repeat CT with RB (discuss below) Pt aware of cxr-- pt to have prior to appt on Friday.

## 2013-06-10 ENCOUNTER — Ambulatory Visit: Payer: BC Managed Care – PPO | Admitting: Nurse Practitioner

## 2013-06-10 ENCOUNTER — Encounter (INDEPENDENT_AMBULATORY_CARE_PROVIDER_SITE_OTHER): Payer: Self-pay

## 2013-06-10 ENCOUNTER — Ambulatory Visit (INDEPENDENT_AMBULATORY_CARE_PROVIDER_SITE_OTHER): Payer: BC Managed Care – PPO | Admitting: Nurse Practitioner

## 2013-06-10 VITALS — BP 110/80 | Resp 12 | Wt 100.1 lb

## 2013-06-10 DIAGNOSIS — Z8759 Personal history of other complications of pregnancy, childbirth and the puerperium: Secondary | ICD-10-CM

## 2013-06-10 DIAGNOSIS — R51 Headache: Secondary | ICD-10-CM

## 2013-06-10 DIAGNOSIS — M954 Acquired deformity of chest and rib: Secondary | ICD-10-CM

## 2013-06-10 DIAGNOSIS — Z8742 Personal history of other diseases of the female genital tract: Secondary | ICD-10-CM

## 2013-06-10 DIAGNOSIS — Z0000 Encounter for general adult medical examination without abnormal findings: Secondary | ICD-10-CM

## 2013-06-10 DIAGNOSIS — R519 Headache, unspecified: Secondary | ICD-10-CM

## 2013-06-10 LAB — CBC
HCT: 41.9 % (ref 36.0–46.0)
MCV: 95.2 fl (ref 78.0–100.0)
Platelets: 196 10*3/uL (ref 150.0–400.0)
RBC: 4.4 Mil/uL (ref 3.87–5.11)
WBC: 9.6 10*3/uL (ref 4.5–10.5)

## 2013-06-10 LAB — RENAL FUNCTION PANEL
Glucose, Bld: 93 mg/dL (ref 70–99)
Phosphorus: 3.6 mg/dL (ref 2.3–4.6)
Potassium: 3.6 mEq/L (ref 3.5–5.1)
Sodium: 138 mEq/L (ref 135–145)

## 2013-06-10 LAB — URIC ACID: Uric Acid, Serum: 3.3 mg/dL (ref 2.4–7.0)

## 2013-06-10 LAB — HEPATIC FUNCTION PANEL
Bilirubin, Direct: 0.2 mg/dL (ref 0.0–0.3)
Total Bilirubin: 1.1 mg/dL (ref 0.3–1.2)

## 2013-06-10 NOTE — Progress Notes (Signed)
Subjective:    Jessica Osborne is a 37 y.o. female who presents for follow-up of R sided HA, behind R eye. I treated this as sinusitis as pt c/o prurlent nasal drainage. She was on doxycycline. She did not improve, I referred her to neurology who felt her HA might be r/t OTC med rebound HA. She was treated w/short steroid dose pack and has avoided tylenol and ibuprophen.  These measures have not relieved pain. She was given script for elavil, but has not started it. Pt reports that she had brain MRI showing "vascular abnormality". Pt is to f/u w/neuro in 3 mos. Headaches are occurring every day and pain is continuous. It is associated with blurred vision. Pt had optometry exam and was told she may have early glaucoma. She was not given optical prescription. Additionally, she has recently noticed a decreased sensation across her R cheek-under her eye, adjacent to nose. Work attendance or other daily activities are not affected by the headaches, but the anxiety produced over potential causes of the HA is becoming an overwhelming issue. The patient denies depression, dizziness, loss of balance, muscle weakness, speech difficulties and vomiting in the early morning. In addition, she continues to have burning pain along L ribs. Pt c/o ribs protruding compared to R side. Pain is intermittent, occurring multiple times daily and has persisted for 5 mos. Pain is aggravated by deep inspiration. It occurred less frequently when she took doxycycline recently. It did not get better when she took ibuprophen nor prednisone. She has had multiple chest xrays, CT scans and a bronchoscopy. Interestingly, radiology report from 1/14 cxr at [facility] questioned nodular lesion at 9th posterior rib vs nipple shadow, and linear opacities adjacent to nodule. I wonder if this represents a MSK cause for pain.  The following portions of the patient's history were reviewed and updated as appropriate: allergies, current medications, past family  history, past medical history, past social history, past surgical history and problem list.  Review of Systems Constitutional: negative for chills and fevers, positive for fatigue and sense of "illness"/just "don't feel well'. Eyes: positive for visual disturbance, negative for irritation and redness Ears, nose, mouth, throat, and face: positive for change in sensation under R eye, across cheek Respiratory: positive for SOB when experiences rib pain, negative for cough, sputum and wheezing Gastrointestinal: positive for early satiety & c/o "full feeling upper L abdomen, negative for change in bowel habits, nausea, reflux symptoms and vomiting Integument/breast: positive for rash Musculoskeletal:positive for L rib pain described as burning, aggravated by deep breathing. Neurological: positive for headaches and numbness R side of face, pressure behind R eye w/vision disturbance, negative for coordination problems, dizziness, gait problems, memory problems, seizures, speech problems, tremors and weakness Behavioral/Psych: positive for anxiety and depression, negative for abusive relationship, aggressive behavior, bad mood, excessive alcohol consumption, illegal drug usage, irritability and tobacco use Allergic/Immunologic: positive for fall allergies    Objective:    BP 110/80  Resp 12  Wt 100 lb 1.9 oz (45.414 kg)  BMI 16.17 kg/m2  LMP 05/12/2010 General appearance: alert, cooperative, appears stated age and no distress Head: Normocephalic, without obvious abnormality, atraumatic Eyes: negative findings: lids and lashes normal, conjunctivae and sclerae normal, corneas clear, pupils equal, round, reactive to light and accomodation and visual fields full to confrontation, positive findings: none Lungs: clear to auscultation bilaterally Heart: regular rate and rhythm, S1, S2 normal, no murmur, click, rub or gallop Abdomen: tender to palpation LUQ. No HSM. Skin: papular & pustular  rash chest,  abdomen Neurologic: grossly nml. Sensation on face is intact-used monofilament-light pressure MSK: chest-L rib cage is shaped markedly different than R. Intercostal spaces between ribs are increased compared to R side. Pt reports this as change. The Left ribs are taking on a barrel shape:AP diameter is widening/or increasing in depth. This is not occuring on Right.    Assessment & Plan  1. 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 Pt states rib shape has changed: AP diameter appears to be deepening only on L side. L intercostal spaces are wider than Right. No obvious scoliosis. Pt states pain improved when on doxycycline- could be explained by anti-inflamm properties of ABX, but does not make sense as steroid nor ibuprophen relieved pain.  DD: inflammatory arthritis, Paget's Disease, normal anatomical variance, neoplastic. - DG Ribs Bilateral W/Chest; Future - DG Thoracic Spine W/Swimmers; Future - Uric acid, eval alk phos, ca, liver func, renal-eval for Paget's - POCT urine pregnancy to clear for xrays  2. Headache, persistent, behind R eye, visual disturbance & facial numbness Saw neuro recently. Per pt report brain MRI shows "vascular abnormality". 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 I will request Brain MRI & neuro notes. - Lupus anticoagulant - Cardiolipin antibodies, IgG, IgM, IgA - Beta-2 Glycoprotein I Ab,G/M

## 2013-06-10 NOTE — Patient Instructions (Signed)
Perhaps your rib change shape will explain the burning pain you are having. I will call you once I get the results, also I will try to consult with Dr Everlena Cooper regarding brain MRI. Please opthalmology. Take care!

## 2013-06-11 ENCOUNTER — Ambulatory Visit (HOSPITAL_BASED_OUTPATIENT_CLINIC_OR_DEPARTMENT_OTHER)
Admission: RE | Admit: 2013-06-11 | Discharge: 2013-06-11 | Disposition: A | Discharge status: Home / Self Care | Payer: BC Managed Care – PPO | Source: Ambulatory Visit | Attending: Nurse Practitioner | Admitting: Nurse Practitioner

## 2013-06-11 ENCOUNTER — Telehealth: Payer: Self-pay | Admitting: Nurse Practitioner

## 2013-06-11 DIAGNOSIS — M47814 Spondylosis without myelopathy or radiculopathy, thoracic region: Secondary | ICD-10-CM | POA: Insufficient documentation

## 2013-06-11 DIAGNOSIS — M954 Acquired deformity of chest and rib: Secondary | ICD-10-CM

## 2013-06-11 DIAGNOSIS — R079 Chest pain, unspecified: Secondary | ICD-10-CM | POA: Insufficient documentation

## 2013-06-11 DIAGNOSIS — J302 Other seasonal allergic rhinitis: Secondary | ICD-10-CM

## 2013-06-11 DIAGNOSIS — J984 Other disorders of lung: Secondary | ICD-10-CM | POA: Insufficient documentation

## 2013-06-11 LAB — CARDIOLIPIN ANTIBODIES, IGG, IGM, IGA: Anticardiolipin IgM: 3 MPL U/mL (ref <11)

## 2013-06-11 MED ORDER — AZELASTINE HCL 0.1 % NA SOLN: 1.0000 | Freq: Two times a day (BID) | NASAL | Status: DC

## 2013-06-11 NOTE — Telephone Encounter (Signed)
Patient came in for OV 06/10/13

## 2013-06-11 NOTE — Telephone Encounter (Signed)
Discussed xray results & labs-alk phos nml, reassuring of no paget's disease. Still watining on antiphos AB synd labs. Rec she f/u w/Dr Delton Coombes given radiology finding of "band-like" density interpreted as post bronch reactive changes. Pt wants to schedule MRI of ribs-area of pain. Will order. Pt mentions [facility] Not familiar w/test. She will f/u w/them next week.  Pt is hoarse. Likely fall allergies. Will prescribe azelastine. Cont to use flonase. Start sinus rinses. MRI brain does not show sinus abnmlty. 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 Pain is behind R eye. May be allergy flare.  Consider starting elavil if allergy Tx not effective.

## 2013-06-12 ENCOUNTER — Other Ambulatory Visit: Payer: Self-pay | Admitting: Nurse Practitioner

## 2013-06-12 ENCOUNTER — Other Ambulatory Visit: Payer: BC Managed Care – PPO

## 2013-06-12 ENCOUNTER — Telehealth: Payer: Self-pay | Admitting: Nurse Practitioner

## 2013-06-12 ENCOUNTER — Ambulatory Visit: Payer: BC Managed Care – PPO | Admitting: Emergency Medicine

## 2013-06-12 DIAGNOSIS — M954 Acquired deformity of chest and rib: Secondary | ICD-10-CM

## 2013-06-12 NOTE — Telephone Encounter (Signed)
Ins co will not approve MRI of chest. Pt will be advised. Recmd she f/u w/pulmonology. Still waiting on all labs to r/o antiphospholipid Ab syndrome. May benefit from rheum work up.

## 2013-06-13 ENCOUNTER — Ambulatory Visit (HOSPITAL_BASED_OUTPATIENT_CLINIC_OR_DEPARTMENT_OTHER): Payer: BC Managed Care – PPO

## 2013-06-15 ENCOUNTER — Encounter: Payer: Self-pay | Admitting: *Deleted

## 2013-06-15 ENCOUNTER — Telehealth: Payer: Self-pay | Admitting: *Deleted

## 2013-06-15 DIAGNOSIS — R911 Solitary pulmonary nodule: Secondary | ICD-10-CM

## 2013-06-15 DIAGNOSIS — R509 Fever, unspecified: Secondary | ICD-10-CM

## 2013-06-15 NOTE — Telephone Encounter (Addendum)
Patient did not have the MRI done because her insurance does not want to pay. They say it is a chest x-ray, should be x-ray of left ribb. Patient stated that her insurance company said the order was coded wrong. Please advise?

## 2013-06-16 ENCOUNTER — Other Ambulatory Visit: Payer: BC Managed Care – PPO

## 2013-06-16 ENCOUNTER — Encounter: Payer: Self-pay | Admitting: Emergency Medicine

## 2013-06-16 ENCOUNTER — Encounter: Payer: Self-pay | Admitting: *Deleted

## 2013-06-16 ENCOUNTER — Ambulatory Visit (INDEPENDENT_AMBULATORY_CARE_PROVIDER_SITE_OTHER): Payer: BC Managed Care – PPO | Admitting: Emergency Medicine

## 2013-06-16 VITALS — BP 120/78 | HR 84 | Temp 98.2°F | Ht 66.0 in | Wt 99.2 lb

## 2013-06-16 DIAGNOSIS — R9389 Abnormal findings on diagnostic imaging of other specified body structures: Secondary | ICD-10-CM

## 2013-06-16 NOTE — Progress Notes (Signed)
Subjective:    Patient ID: Jessica Osborne, female    DOB: [birth year], 37 y.o.   MRN: [id number]  HPI 37 yo never smoker, hx of HA and little other PMH xcept for 2 episodes of CAP in 2012, 2013 in LLL. These were characterized by L flank and back pain, f/c, aches, little cough without mucous. Then in May '14 had the pain, dyspnea. Could hurt w insp, movement, still happens. Some cough, non productive. She was started on pulmicort in May, seemed to help her breathing some. Albuterol prn also helpful. CT scan in May showed LLL infiltrate with ? Nodular component. Spirometry recently w ? Obstruction.  She was tested for autoimmune processes July > all negative (labs scanned in).    CT 08/02/11 >> LL PNA with some associated mediastinal LAD CT scan 02/26/13 >> unavailable to me; radiology report shows infiltrate dorsal LLL with small irregular nodular opacity 6x2mm, no mediastinal LAD.   ROV 04/08/13 -- returns for followup regarding abnormal CT scan of the chest as above. Since this time have been able to review her CT scan from 02/26/13. There is a left lower lobe nodular infiltrate in a different location compared with her film in 2012. This had a groundglass component. Since her last visit she had another trip to the emergency department and a another CT was performed at [facility], appears to be a CT abdomen and pelvis on 03/30/13. The left lower lobe tree in bud nodularity was again seen.   ROV 06/16/13 -- follows up for a waxing/waning L lung iinfiltrate, most recently characterized on CT as GGI and micronodular disease in the LLL. We performed FOB with biopsies and micro >> all negative including AFL and fungal. She continues to have constitutional complaints, fever, L CP and tightness. Some MSK but some is deeper. Fatigue, HA.    Review of Systems  Constitutional: Positive for unexpected weight change. Negative for fever.  HENT: Positive for dental problem. Negative for congestion, ear pain,  nosebleeds, postnasal drip, rhinorrhea, sinus pressure, sneezing, sore throat and trouble swallowing.   Eyes: Negative for redness and itching.  Respiratory: Positive for chest tightness and shortness of breath. Negative for cough and wheezing.   Cardiovascular: Positive for chest pain. Negative for palpitations and leg swelling.  Gastrointestinal: Negative for nausea and vomiting.  Genitourinary: Negative for dysuria.  Musculoskeletal: Negative for joint swelling.  Skin: Negative for rash.  Neurological: Positive for headaches.  Hematological: Does not bruise/bleed easily.  Psychiatric/Behavioral: Negative for dysphoric mood. The patient is not nervous/anxious.       Objective:   Physical Exam Filed Vitals:   06/16/13 1608  BP: 120/78  Pulse: 84  Temp: 98.2 F (36.8 C)  TempSrc: Oral  Height: 5\' 6"  (1.676 m)  Weight: 99 lb 3.2 oz (44.997 kg)  SpO2: 98%   Gen: Pleasant, thin, in no distress,  normal affect  ENT: No lesions,  mouth clear,  oropharynx clear, no postnasal drip  Neck: No JVD, no TMG, no carotid bruits  Lungs: No use of accessory muscles, few soft exp squeeks at end-exp on L  Cardiovascular: RRR, heart sounds normal, no murmur or gallops, no peripheral edema  Musculoskeletal: No deformities, no cyanosis or clubbing, mild L intercostal tenderness to palp  Neuro: alert, non focal  Skin: Warm, no lesions or rashes     Assessment & Plan:  Abnormal CT scan, chest GGI in the LLL. I do not believe that it is the cause of her constitutional  sx. Still needs to be worked up further as FOB was negative - high res Ct scan now.  - if the abnormality is still present then I will refer her to TCTS to consider VATS bx.

## 2013-06-16 NOTE — Patient Instructions (Signed)
Blood work today We will repeat your Ct scan of the chest. Depending on that result we will decide whether we need to pursue another lung biopsy.  Follow with Dr Delton Coombes in 2 months

## 2013-06-16 NOTE — Assessment & Plan Note (Signed)
GGI in the LLL. I do not believe that it is the cause of her constitutional sx. Still needs to be worked up further as FOB was negative - high res Ct scan now.  - if the abnormality is still present then I will refer her to TCTS to consider VATS bx.

## 2013-06-16 NOTE — Telephone Encounter (Signed)
Explained to pt ins denial of MRI of ribs. T-spine shows spondylosis-wonder if this is causing ribs to spread & contributing to chest wall pain? She has pulm appt today. C/o low grade fever last night & today.  She has not been tested for TB or histoplasma-both have potential to form granulomas. She has RUL granuloma, LLL nodule seen on chest CT. She has risk factor for histoplas-[place] [employer]. Low risk for TB-father & grandfather were both in [employer], but no documented case of active or latent TB.   Will order labs today to screen for both and CBC as c/o fever, also periph smear due to c/o overal feeling of "not well" for many mos. Discussed all w/pt.

## 2013-06-17 ENCOUNTER — Telehealth: Payer: Self-pay | Admitting: Nurse Practitioner

## 2013-06-17 NOTE — Telephone Encounter (Signed)
Patient has decided to have the MRI. She would like to know if you think she should have a thoracic MRI instead of a chest. Maybe her insurance would be willing to cover it? She also mentioned pain lower in her abdomin. Patient also mentioned she has been running a fever for 3 days. Please contact patient.

## 2013-06-18 ENCOUNTER — Other Ambulatory Visit: Payer: Self-pay | Admitting: Nurse Practitioner

## 2013-06-18 ENCOUNTER — Ambulatory Visit: Payer: BC Managed Care – PPO | Admitting: Nurse Practitioner

## 2013-06-18 DIAGNOSIS — R0789 Other chest pain: Secondary | ICD-10-CM

## 2013-06-18 LAB — QUANTIFERON TB GOLD ASSAY (BLOOD)
Interferon Gamma Release Assay: NEGATIVE
Mitogen value: 6.63 IU/mL
Quantiferon Nil Value: 0.02 IU/mL
Quantiferon Tb Ag Minus Nil Value: 0 IU/mL
TB Ag value: 0.02 IU/mL

## 2013-06-18 NOTE — Telephone Encounter (Signed)
Placed order for MRI of thorax L. Looking for myositis or inflammatory arthritis in t spine that will explain chronic pain. Discussed w/pt. She states she has run fever 100.2 for last 4 nights. No fever today, feels a little better. If continues to run fever over next few days adv to call for appt. Sooner if fever spikes over 100.4.

## 2013-06-19 ENCOUNTER — Other Ambulatory Visit: Payer: BC Managed Care – PPO

## 2013-06-19 ENCOUNTER — Telehealth: Payer: Self-pay | Admitting: Emergency Medicine

## 2013-06-19 NOTE — Telephone Encounter (Signed)
I spoke with pt. She reports if MRI would be able to replace CT scan RB wants her to have done. She reports her PCP is wanting her to have this done d/t sternum tilted and left rib cage extended out. She is scheduled to have an MRI done at [facility] med center. Pt also had lab done on 06/16/13. Please advise Dr. Leonard Schwartz  thanks

## 2013-06-20 ENCOUNTER — Ambulatory Visit (HOSPITAL_BASED_OUTPATIENT_CLINIC_OR_DEPARTMENT_OTHER)
Admission: RE | Admit: 2013-06-20 | Discharge: 2013-06-20 | Disposition: A | Discharge status: Home / Self Care | Payer: BC Managed Care – PPO | Source: Ambulatory Visit | Attending: Nurse Practitioner | Admitting: Nurse Practitioner

## 2013-06-20 DIAGNOSIS — J90 Pleural effusion, not elsewhere classified: Secondary | ICD-10-CM | POA: Insufficient documentation

## 2013-06-20 DIAGNOSIS — R918 Other nonspecific abnormal finding of lung field: Secondary | ICD-10-CM | POA: Insufficient documentation

## 2013-06-20 DIAGNOSIS — R0789 Other chest pain: Secondary | ICD-10-CM

## 2013-06-20 DIAGNOSIS — M954 Acquired deformity of chest and rib: Secondary | ICD-10-CM | POA: Insufficient documentation

## 2013-06-22 ENCOUNTER — Telehealth: Payer: Self-pay | Admitting: Emergency Medicine

## 2013-06-22 ENCOUNTER — Telehealth: Payer: Self-pay | Admitting: Nurse Practitioner

## 2013-06-22 DIAGNOSIS — R918 Other nonspecific abnormal finding of lung field: Secondary | ICD-10-CM

## 2013-06-22 DIAGNOSIS — J189 Pneumonia, unspecified organism: Secondary | ICD-10-CM

## 2013-06-22 MED ORDER — LEVOFLOXACIN 500 MG PO TABS: 500.0000 mg | ORAL_TABLET | Freq: Every day | ORAL | Status: DC

## 2013-06-22 NOTE — Telephone Encounter (Signed)
Pt is calling requesting results for MRI.  No results listed in EPIC.  Pt requests to be called when results are available.  [phone]

## 2013-06-22 NOTE — Telephone Encounter (Signed)
Please let pt know that her quantiferon gold is negative.  An MRI is not the best test for me to see the parenchyma of the lungs. If she would like to get the MRI first, then I could look at it, decide whether it is adequate or if she needs CT scan after

## 2013-06-22 NOTE — Telephone Encounter (Signed)
Pt is aware of RB recs. Will call us with the decision she makes.

## 2013-06-22 NOTE — Telephone Encounter (Signed)
Please address MRI results. Thanks.

## 2013-06-23 ENCOUNTER — Other Ambulatory Visit: Payer: BC Managed Care – PPO

## 2013-06-23 NOTE — Telephone Encounter (Signed)
lmomtcb for pt 

## 2013-06-23 NOTE — Telephone Encounter (Signed)
I spoke with pt. Advised her will call once RB advises [place] results. Looking in pt chart it does not look like we ordered this. It shows PCP did. Please advise RB thanks

## 2013-06-23 NOTE — Telephone Encounter (Signed)
Reviewed the MRI results w her. I have ordered the CT scan and the TCTS referral already.

## 2013-06-23 NOTE — Telephone Encounter (Signed)
Patient calling back requesting MRI results.  [phone]

## 2013-06-23 NOTE — Telephone Encounter (Signed)
Spoke with pt and advised of MRI results per Dr Delton Coombes.  Pt states that her primary md started her on Levaquin yesterday due to the possible pnuemonia .  Pt is ok with having the ct scan but would like to speak with Dr Delton Coombes to have this explained to her in more detail.

## 2013-06-23 NOTE — Telephone Encounter (Signed)
1. Please tell her that the area of the left lung that we have been following is still abnormal. I need to get a CT scan to get better pictures of this and then I would like to refer her to surgery to discuss a biopsy. I will order the scan 2. 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 There is no evidence for an infiltrative rib abnormality or a fracture

## 2013-06-26 ENCOUNTER — Ambulatory Visit (INDEPENDENT_AMBULATORY_CARE_PROVIDER_SITE_OTHER)
Admission: RE | Admit: 2013-06-26 | Discharge: 2013-06-26 | Disposition: A | Discharge status: Home / Self Care | Payer: BC Managed Care – PPO | Source: Ambulatory Visit | Attending: Emergency Medicine | Admitting: Emergency Medicine

## 2013-06-26 DIAGNOSIS — R918 Other nonspecific abnormal finding of lung field: Secondary | ICD-10-CM

## 2013-06-29 ENCOUNTER — Telehealth: Payer: Self-pay | Admitting: Emergency Medicine

## 2013-06-29 NOTE — Telephone Encounter (Signed)
Reviewed CT scan with pt. Again, the LLL inflammatory changes are improved compared with scans from [place] and from [place], but they persist. I believe she needs a VATS bx to completely identify. I also underscored with her that it is not clear whether the abnormalities on scan have anything to do with her other complaints like back, chest rib pain. It has been discovered that she has a pectus deformity that could be contributing. She is to see Dr Dorris Fetch on 11/18 to discuss possible bx.

## 2013-06-29 NOTE — Telephone Encounter (Signed)
Please advise RB thanks 

## 2013-06-30 ENCOUNTER — Encounter: Payer: Self-pay | Admitting: Thoracic Surgery (Cardiothoracic Vascular Surgery)

## 2013-06-30 ENCOUNTER — Telehealth: Payer: Self-pay | Admitting: *Deleted

## 2013-06-30 ENCOUNTER — Institutional Professional Consult (permissible substitution) (INDEPENDENT_AMBULATORY_CARE_PROVIDER_SITE_OTHER): Payer: BC Managed Care – PPO | Admitting: Thoracic Surgery (Cardiothoracic Vascular Surgery)

## 2013-06-30 VITALS — BP 109/80 | HR 110 | Resp 20 | Ht 66.0 in | Wt 100.0 lb

## 2013-06-30 DIAGNOSIS — J8410 Pulmonary fibrosis, unspecified: Secondary | ICD-10-CM

## 2013-06-30 DIAGNOSIS — J849 Interstitial pulmonary disease, unspecified: Secondary | ICD-10-CM

## 2013-06-30 NOTE — Telephone Encounter (Signed)
Returned patient's call. Patient stated that she is going to see a surgeon today and she would talk to him about a nodule that has come up on her nose. Patient said after she speaks with surgeon she may call for an appointment with Layne  To discuss this problem. Patient also stated that she received results for CT scan already but would like Laynes view on them.

## 2013-06-30 NOTE — Telephone Encounter (Signed)
Patient left vm requesting results for CT scan patient had done on 06/26/13. Patient also stated that she would to talk to [facility] about something else that's going on with her right now. Patient would like to come in for an ov .

## 2013-06-30 NOTE — Progress Notes (Signed)
PCP is WEAVER, Gwenith Daily, NP Referring Provider is Kelle Darting, NP  Chief Complaint  Patient presents with  . Interstitial Lung Disease    Surgical eval for possible VATS, Chest CT 06/23/13    HPI: 37 year old woman presents for evaluation for possible lung biopsy.  Jessica Osborne is a 37 year old woman who presents with multiple complaints. She has a history of pneumonia in the past. He may she started having pain in her back. She described this as an irritation and burning sensation. She had a chest x-ray done which showed "her lung was inflamed". She was started on inhaled steroid, but that did not really help. She had a CT scan (not available for review) which showed a tree in Bud infiltrate. In June or July she had bronchoscopy. Cultures from that were negative and no definitive diagnosis was made. She recently had a followup CT which showed some improvement but not complete resolution of the tree in Bud pattern in the left lower lobe.  She has continued to have symptoms. She describes a left lower back pain. She also describes a sensation of tightness or constriction around her chest. She has a cough, nonproductive. She is not having any wheezing, but she does often feel short of breath both at rest and with exertion. She also recently was having headaches and blurry vision in her right eye. She says that is actually gotten better recently. She also complains of abdominal discomfort, which she describes as more of a fullness than a pain. She has early satiety and general loss of appetite. She has lost about 18 pounds over 6 months and 10 pounds over the last 3 months. She noticed a mass at the left costal margin, which MRI showed was her ribs.   Past Medical History  Diagnosis Date  . Chicken pox   . Urinary tract bacterial infections   . Miscarriage     Past Surgical History  Procedure Laterality Date  . Tonsillectomy    . Video bronchoscopy Bilateral 04/16/2013    Procedure: VIDEO  BRONCHOSCOPY WITH FLUORO;  Surgeon: Leslye Peer, MD;  Location: [facility];  Service: Cardiopulmonary;  Laterality: Bilateral;    Family History  Problem Relation Age of Onset  . Melanoma Maternal Aunt   . Asthma Brother   . Allergies Brother   . Rheum arthritis Mother   . Arthritis Mother     rheumatoid  . Lupus Mother   . Diabetes Mother   . Stroke Maternal Grandfather   . Alcohol abuse Paternal Grandfather   . Heart disease Paternal Grandfather   . Heart disease Maternal Grandmother   . Alcohol abuse Paternal Grandmother     Social History History  Substance Use Topics  . Smoking status: Never Smoker   . Smokeless tobacco: Never Used  . Alcohol Use: Yes     Comment: Occ    Current Outpatient Prescriptions  Medication Sig Dispense Refill  . albuterol (PROVENTIL HFA;VENTOLIN HFA) 108 (90 BASE) MCG/ACT inhaler Inhale 2 puffs into the lungs every 6 (six) hours as needed for wheezing.      . cholecalciferol (VITAMIN D) 1000 UNITS tablet Take 1,000 Units by mouth daily.      . clonazePAM (KLONOPIN) 0.5 MG tablet Take 0.5 mg by mouth daily.       . Multiple Vitamins-Minerals (MULTIVITAMIN PO) Take by mouth daily.      . Omega-3 Fatty Acids (FISH OIL PO) Take by mouth daily.       No  current facility-administered medications for this visit.    Allergies  Allergen Reactions  . Penicillins Rash    Review of Systems  Constitutional: Positive for activity change, appetite change, fatigue and unexpected weight change (lost 10 pounds in 3 months).  Eyes: Positive for visual disturbance (blurred vision right eye).  Respiratory: Positive for cough (nonproductive) and shortness of breath. Negative for wheezing.        Chest wall pain  Gastrointestinal: Positive for abdominal pain (fullness, early satiety).  Musculoskeletal: Positive for back pain.  Neurological: Positive for headaches.  Psychiatric/Behavioral:       Stress, anxiety  All other systems reviewed and are  negative.    BP 109/80  Pulse 110  Resp 20  Ht 5\' 6"  (1.676 m)  Wt 100 lb (45.36 kg)  BMI 16.15 kg/m2  SpO2 99% Physical Exam  Vitals reviewed. Constitutional: She is oriented to person, place, and time. No distress.  thin  HENT:  Head: Normocephalic and atraumatic.  Eyes: EOM are normal. Pupils are equal, round, and reactive to light.  Neck: Neck supple. No thyromegaly present.  Cardiovascular: Normal rate, regular rhythm, normal heart sounds and intact distal pulses.  Exam reveals no friction rub.   No murmur heard. Pulmonary/Chest: Effort normal. She has no wheezes. She has no rales.  Abdominal: Soft. There is no tenderness.  Musculoskeletal: Normal range of motion. She exhibits no edema.  Lymphadenopathy:    She has no cervical adenopathy.  Neurological: She is alert and oriented to person, place, and time. No cranial nerve deficit.  Skin: Skin is warm and dry.     Diagnostic Tests:  CT chest 06/26/2013 CT CHEST WITHOUT CONTRAST  TECHNIQUE:  Multidetector CT imaging of the chest was performed following the  standard protocol without IV contrast.  COMPARISON: MR chest 06/20/2013, chest radiograph 06/11/2013 and CT  chest 08/02/2011.  FINDINGS:  No pathologically enlarged mediastinal or axillary lymph nodes.  Hilar regions are difficult to definitively evaluate without IV  contrast but are grossly unremarkable. Heart size normal. No  pericardial effusion.  Biapical pleural parenchymal scarring. Mild ill-defined  peribronchovascular ground-glass and mucoid impaction in the lingula  (series 5, image 30). Improving airspace consolidation in the left  lower lobe, without complete resolution. 3 mm subpleural lymph node  along the left major fissure is stable and indicative of a  subpleural lymph node. No subpleural reticulation, traction  bronchiectasis/ bronchiolectasis, architectural distortion or  honeycombing. No pleural fluid. Airway is unremarkable.  Incidental  imaging of the upper abdomen shows no acute findings. No  worrisome lytic or sclerotic lesions. Degenerative changes are seen  in the spine.  IMPRESSION:  1. Improving consolidation in the left lower lobe, most consistent  with resolving pneumonia.  2. Ill-defined peribronchovascular airspace disease and mucoid  impaction in the lingula, indicative of an infectious bronchiolitis  appear  Electronically Signed  By: Leanna Battles M.D.  On: 06/26/2013 16:12  Impression:  37 year old woman with a complex medical history who has a non-resolving infiltrate in her left lower lobe. I do not have all of her scans to review. It does sound like this has improved somewhat since last summer but has certainly not resolve completely. It is unclear what this represents. It could be infectious or inflammatory. It would be reasonable to do a VATS lung biopsy for diagnostic purposes. However, I'm not sure that will shed much light on her entire complex symptoms, particularly her abdominal complaints.  I had a long discussion with Mrs.  Osborne and her husband. I described the operative procedure to them. They understand the need for general anesthesia, the incisions to be used, and the general nature of the procedure. They understand that it would be diagnostic and not therapeutic. I discussed with them the indications, risks, benefits, and alternatives. They understand that the risks include but are not limited to death, bleeding, possible need for transfusion, DVT, PE, infection, prolonged air leak, cardiac arrhythmias, as well as the possibility of unforeseeable complications.  After a long discussion she wishes to think over her options before deciding whether to proceed with a lung biopsy. She will contact our office if she decides to proceed

## 2013-07-01 ENCOUNTER — Telehealth: Payer: Self-pay | Admitting: Nurse Practitioner

## 2013-07-01 ENCOUNTER — Other Ambulatory Visit: Payer: Self-pay | Admitting: Nurse Practitioner

## 2013-07-01 DIAGNOSIS — F411 Generalized anxiety disorder: Secondary | ICD-10-CM

## 2013-07-01 MED ORDER — CLONAZEPAM 0.5 MG PO TABS: 0.5000 mg | ORAL_TABLET | Freq: Every day | ORAL | Status: DC

## 2013-07-01 NOTE — Telephone Encounter (Signed)
I can print the rx but it would need your signature.  This is a rx that can be faxed though if that helps.

## 2013-07-01 NOTE — Telephone Encounter (Signed)
I ordered this med, but it has to be printed at OR & pt has to pick up, or Misty Stanley may be able to fax it. Thanks. Please let pt know.

## 2013-07-01 NOTE — Telephone Encounter (Signed)
Misty Stanley can you fax this Rx?

## 2013-07-01 NOTE — Telephone Encounter (Signed)
Complete

## 2013-07-02 ENCOUNTER — Telehealth: Payer: Self-pay | Admitting: *Deleted

## 2013-07-02 DIAGNOSIS — F411 Generalized anxiety disorder: Secondary | ICD-10-CM

## 2013-07-02 MED ORDER — CLONAZEPAM 0.5 MG PO TABS: 0.5000 mg | ORAL_TABLET | Freq: Every day | ORAL | Status: DC

## 2013-07-02 NOTE — Telephone Encounter (Signed)
Patient notified

## 2013-07-02 NOTE — Telephone Encounter (Signed)
Most definitely caused by ABX. She should eat yogurt daily or take Align: 1 capsule daily for next 10-15 days.

## 2013-07-02 NOTE — Telephone Encounter (Signed)
Patient left vm concerning the antibiotic she finished a week ago. Patient stated that 2 days after she finished the antibiotic she developed diarrhea and has had it for three days now. Patient would like your opinion about whether you think diarrhea was caused by the antibiotic. Please advise?

## 2013-07-03 NOTE — Telephone Encounter (Signed)
Please call patient. She has a question. She has had diarrhea for 5 days.

## 2013-07-03 NOTE — Telephone Encounter (Signed)
Returned call to patient. Per Layne informed patient that diarrhea can last up to 10 days. Patient can try Align and Imodium, if symptoms have not improved by then patient is to contact office.

## 2013-07-14 ENCOUNTER — Encounter: Payer: Self-pay | Admitting: Nurse Practitioner

## 2013-07-16 ENCOUNTER — Other Ambulatory Visit: Payer: Self-pay | Admitting: Nurse Practitioner

## 2013-07-16 ENCOUNTER — Telehealth: Payer: Self-pay | Admitting: *Deleted

## 2013-07-16 DIAGNOSIS — R0781 Pleurodynia: Secondary | ICD-10-CM

## 2013-07-16 NOTE — Telephone Encounter (Signed)
Patient left vm requesting a referral or a recommendation to see a specialist concerning her lower back and left side. Patient stated that she is still having pain. Please advise?

## 2013-07-16 NOTE — Progress Notes (Signed)
error 

## 2013-07-16 NOTE — Progress Notes (Signed)
Patient states that since she took the antibiotics she has starting having the full feeling again. Patient wanted you to know that she started back on the omeprazole 4 days ago and also she is still taking probiotic.

## 2013-08-20 ENCOUNTER — Encounter: Payer: Self-pay | Admitting: Nurse Practitioner

## 2013-08-20 DIAGNOSIS — Z0000 Encounter for general adult medical examination without abnormal findings: Secondary | ICD-10-CM | POA: Insufficient documentation

## 2013-08-20 DIAGNOSIS — Z8269 Family history of other diseases of the musculoskeletal system and connective tissue: Secondary | ICD-10-CM | POA: Insufficient documentation

## 2013-08-26 ENCOUNTER — Telehealth: Payer: Self-pay | Admitting: Neurology

## 2013-08-26 NOTE — Telephone Encounter (Signed)
Pt cancelled 09/02/13 appt w/ Dr. Everlena CooperJaffe for a 3 month follow up. Pt says she is feeling better and does not need follow up at this time. She will call if anything changes / Sherri S.

## 2013-09-02 ENCOUNTER — Ambulatory Visit: Payer: BC Managed Care – PPO | Admitting: Neurology

## 2013-09-03 ENCOUNTER — Telehealth: Payer: Self-pay | Admitting: *Deleted

## 2013-09-03 NOTE — Telephone Encounter (Signed)
Patient left vm requesting referral from Layne [facility]. Per Layne patient needs to contact her current pulmonologist Dr. Delton CoombesByrum to request the referral. Called patient to let her know.

## 2013-09-30 ENCOUNTER — Other Ambulatory Visit: Payer: Self-pay | Admitting: Nurse Practitioner

## 2013-09-30 ENCOUNTER — Telehealth: Payer: Self-pay | Admitting: Family Medicine

## 2013-09-30 DIAGNOSIS — M549 Dorsalgia, unspecified: Secondary | ICD-10-CM

## 2013-09-30 MED ORDER — TRAMADOL HCL 50 MG PO TABS: ORAL_TABLET | ORAL | Status: DC

## 2013-09-30 NOTE — Progress Notes (Signed)
Pt still having L sided thoracic pain. [place]. Sent tramadol.

## 2013-09-30 NOTE — Telephone Encounter (Signed)
[name] stating that she was having some back pain and would like to know if she could get a mild pain medication or muscle relaxer.  Patient states that [facility] [place] [facility] and has an appointment scheduled with a Dr. For when she returns but wanted to see if she could get a small supply to help her through her vacation.  Please advise.

## 2013-10-01 NOTE — Progress Notes (Signed)
Pt notified of refill

## 2013-10-19 ENCOUNTER — Telehealth: Payer: Self-pay | Admitting: Emergency Medicine

## 2013-10-19 NOTE — Telephone Encounter (Signed)
Patient returning call.

## 2013-10-19 NOTE — Telephone Encounter (Signed)
Called spoke w/ pt. Made her aware to call [address] to request this since she is wanting this placed on disc. # giving. Nothing further needed

## 2013-10-19 NOTE — Telephone Encounter (Signed)
[id number] x1 

## 2013-11-09 ENCOUNTER — Encounter: Payer: Self-pay | Admitting: Nurse Practitioner

## 2014-06-14 ENCOUNTER — Encounter: Payer: Self-pay | Admitting: Nurse Practitioner

## 2014-08-02 ENCOUNTER — Ambulatory Visit (INDEPENDENT_AMBULATORY_CARE_PROVIDER_SITE_OTHER): Payer: BC Managed Care – PPO | Admitting: Nurse Practitioner

## 2014-08-02 ENCOUNTER — Encounter: Payer: Self-pay | Admitting: Nurse Practitioner

## 2014-08-02 VITALS — BP 116/82 | HR 75 | Temp 98.2°F | Ht 65.0 in | Wt 115.0 lb

## 2014-08-02 DIAGNOSIS — J0110 Acute frontal sinusitis, unspecified: Secondary | ICD-10-CM

## 2014-08-02 MED ORDER — DOXYCYCLINE HYCLATE 100 MG PO TABS: 100.0000 mg | ORAL_TABLET | Freq: Two times a day (BID) | ORAL | Status: DC

## 2014-08-02 NOTE — Patient Instructions (Signed)
Start antibiotic. Eat yogurt daily at lunch or afternoon to help prevent diarrhea that can be caused by antibiotic. Start daily sinus rinses (Neilmed Sinus rinse) for at least 5-7 days. Please call for re-evaluation if you are not improving. Schedule physical at your convenience. Great to see you! Merry Christmas!     Sinusitis Sinusitis is redness, soreness, and swelling (inflammation) of the paranasal sinuses. Paranasal sinuses are air pockets within the bones of your face (beneath the eyes, the middle of the forehead, or above the eyes). In healthy paranasal sinuses, mucus is able to drain out, and air is able to circulate through them by way of your nose. However, when your paranasal sinuses are inflamed, mucus and air can become trapped. This can allow bacteria and other germs to grow and cause infection. Sinusitis can develop quickly and last only a short time (acute) or continue over a long period (chronic). Sinusitis that lasts for more than 12 weeks is considered chronic.  CAUSES  Causes of sinusitis include:  Allergies.  Structural abnormalities, such as displacement of the cartilage that separates your nostrils (deviated septum), which can decrease the air flow through your nose and sinuses and affect sinus drainage.  Functional abnormalities, such as when the small hairs (cilia) that line your sinuses and help remove mucus do not work properly or are not present. SYMPTOMS  Symptoms of acute and chronic sinusitis are the same. The primary symptoms are pain and pressure around the affected sinuses. Other symptoms include:  Upper toothache.  Earache.  Headache.  Bad breath.  Decreased sense of smell and taste.  A cough, which worsens when you are lying flat.  Fatigue.  Fever.  Thick drainage from your nose, which often is green and may contain pus (purulent).  Swelling and warmth over the affected sinuses. DIAGNOSIS  Your caregiver will perform a physical exam.  During the exam, your caregiver may:  Look in your nose for signs of abnormal growths in your nostrils (nasal polyps).  Tap over the affected sinus to check for signs of infection.  View the inside of your sinuses (endoscopy) with a special imaging device with a light attached (endoscope), which is inserted into your sinuses. If your caregiver suspects that you have chronic sinusitis, one or more of the following tests may be recommended:  Allergy tests.  Nasal culture A sample of mucus is taken from your nose and sent to a lab and screened for bacteria.  Nasal cytology A sample of mucus is taken from your nose and examined by your caregiver to determine if your sinusitis is related to an allergy. TREATMENT  Most cases of acute sinusitis are related to a viral infection and will resolve on their own within 10 days. Sometimes medicines are prescribed to help relieve symptoms (pain medicine, decongestants, nasal steroid sprays, or saline sprays).  However, for sinusitis related to a bacterial infection, your caregiver will prescribe antibiotic medicines. These are medicines that will help kill the bacteria causing the infection.  Rarely, sinusitis is caused by a fungal infection. In theses cases, your caregiver will prescribe antifungal medicine. For some cases of chronic sinusitis, surgery is needed. Generally, these are cases in which sinusitis recurs more than 3 times per year, despite other treatments. HOME CARE INSTRUCTIONS   Drink plenty of water. Water helps thin the mucus so your sinuses can drain more easily.  Use a humidifier.  Inhale steam 3 to 4 times a day (for example, sit in the bathroom with the  shower running).  Apply a warm, moist washcloth to your face 3 to 4 times a day, or as directed by your caregiver.  Use saline nasal sprays to help moisten and clean your sinuses.  Take over-the-counter or prescription medicines for pain, discomfort, or fever only as directed by  your caregiver. SEEK IMMEDIATE MEDICAL CARE IF:  You have increasing pain or severe headaches.  You have nausea, vomiting, or drowsiness.  You have swelling around your face.  You have vision problems.  You have a stiff neck.  You have difficulty breathing. MAKE SURE YOU:   Understand these instructions.  Will watch your condition.  Will get help right away if you are not doing well or get worse. Document Released: 07/30/2005 Document Revised: 10/22/2011 Document Reviewed: 08/14/2011 [facility] Patient Information 2014 [employer] [place].

## 2014-08-02 NOTE — Progress Notes (Signed)
Pre visit review using our clinic review tool, if applicable. No additional management support is needed unless otherwise documented below in the visit note. 

## 2014-08-02 NOTE — Progress Notes (Signed)
   Subjective:    Patient ID: Jessica Osborne, female    DOB: 12/19/1975, 38 y.o.   MRN: [id number]  URI  This is a new problem. The current episode started 1 to 4 weeks ago (3 weeks). The problem has been waxing and waning. There has been no fever. Associated symptoms include congestion, coughing, headaches, a plugged ear sensation, sinus pain and sneezing. Pertinent negatives include no abdominal pain, chest pain, ear pain (popping), nausea, neck pain, sore throat or wheezing. Treatments tried: mucinex. The treatment provided mild relief.      Review of Systems  Constitutional: Negative for fever, chills and fatigue.  HENT: Positive for congestion, postnasal drip and sneezing. Negative for ear pain (popping) and sore throat.   Respiratory: Positive for cough. Negative for chest tightness, shortness of breath and wheezing.   Cardiovascular: Negative for chest pain.  Gastrointestinal: Negative for nausea and abdominal pain.  Musculoskeletal: Negative for neck pain.  Neurological: Positive for headaches.       Objective:   Physical Exam  Constitutional: She is oriented to person, place, and time. She appears well-developed and well-nourished. No distress.  HENT:  Head: Normocephalic and atraumatic.  Left Ear: External ear normal.  Mouth/Throat: Oropharynx is clear and moist. No oropharyngeal exudate.  Clear fluid bilat TM, bones visible  Eyes: Conjunctivae are normal. Right eye exhibits no discharge. Left eye exhibits no discharge.  Neck: Normal range of motion. Neck supple. No thyromegaly present.  Cardiovascular: Normal rate, regular rhythm and normal heart sounds.   No murmur heard. Pulmonary/Chest: Effort normal and breath sounds normal. No respiratory distress. She has no wheezes. She has no rales.  Lymphadenopathy:    She has no cervical adenopathy.  Neurological: She is alert and oriented to person, place, and time.  Skin: Skin is warm and dry.  Psychiatric: She has a normal  mood and affect. Her behavior is normal. Thought content normal.  Vitals reviewed.         Assessment & Plan:  1. Acute frontal sinusitis, recurrence not specified - doxycycline (VIBRA-TABS) 100 MG tablet; Take 1 tablet (100 mg total) by mouth 2 (two) times daily.  Dispense: 14 tablet; Refill: 0 Daily sinus rinses-try first, if improvement then do not fill ABX. F/u PRN

## 2014-08-11 ENCOUNTER — Telehealth: Payer: Self-pay | Admitting: *Deleted

## 2014-08-11 ENCOUNTER — Encounter: Payer: Self-pay | Admitting: Nurse Practitioner

## 2014-08-11 ENCOUNTER — Ambulatory Visit (INDEPENDENT_AMBULATORY_CARE_PROVIDER_SITE_OTHER): Payer: BC Managed Care – PPO | Admitting: Nurse Practitioner

## 2014-08-11 VITALS — BP 106/72 | HR 73 | Temp 97.4°F | Ht 65.0 in | Wt 116.0 lb

## 2014-08-11 DIAGNOSIS — R300 Dysuria: Secondary | ICD-10-CM

## 2014-08-11 NOTE — Progress Notes (Signed)
Pre visit review using our clinic review tool, if applicable. No additional management support is needed unless otherwise documented below in the visit note. 

## 2014-08-11 NOTE — Progress Notes (Signed)
   Subjective:    Patient ID: Jessica Osborne, female    DOB: 08/31/1975, 38 y.o.   MRN: [id number]  Dysuria  This is a new problem. The current episode started today. The problem occurs every urination. The problem has been unchanged. The quality of the pain is described as burning. The pain is mild. There has been no fever. She is sexually active. There is no history of pyelonephritis. Associated symptoms include frequency and nausea (had some transient nausea after taking doxy this am, felt better after ate breakfast). Pertinent negatives include no chills, discharge, flank pain, hematuria, hesitancy, urgency or vomiting. She has tried nothing for the symptoms.      Review of Systems  Constitutional: Negative for fever, chills and fatigue.  Gastrointestinal: Positive for nausea (had some transient nausea after taking doxy this am, felt better after ate breakfast). Negative for vomiting, abdominal pain and diarrhea.  Genitourinary: Positive for dysuria and frequency. Negative for hesitancy, urgency, hematuria and flank pain.       [facility], mirena removed 7-8  mos ago. Using condoms  Musculoskeletal: Negative for back pain.       Objective:   Physical Exam  Constitutional: She is oriented to person, place, and time. She appears well-developed and well-nourished. No distress.  HENT:  Head: Normocephalic and atraumatic.  Eyes: Conjunctivae are normal. Right eye exhibits no discharge. Left eye exhibits no discharge.  Cardiovascular: Normal rate.   Pulmonary/Chest: Effort normal.  Abdominal: Soft. Bowel sounds are normal. She exhibits no distension and no mass. There is tenderness (suprapubic). There is no rebound and no guarding.  Neurological: She is alert and oriented to person, place, and time.  Skin: Skin is warm and dry.  Psychiatric: She has a normal mood and affect. Her behavior is normal. Thought content normal.  Vitals reviewed.         Assessment & Plan:  1. Dysuria - POCT  urinalysis dipstick-nml - Urine culture  High dose vit c until culture comes back  See pt instructions. F/u PRN

## 2014-08-11 NOTE — Patient Instructions (Signed)
Start 1000 mg vitamin C twice daily with food.   Sip hydrating fluids every hour to flush bladder.  My office will call with lab results and any follow up.

## 2014-08-11 NOTE — Telephone Encounter (Signed)
Patient called office to schedule appt for acute.

## 2014-08-13 LAB — URINE CULTURE
Colony Count: NO GROWTH
ORGANISM ID, BACTERIA: NO GROWTH

## 2014-08-18 LAB — POCT URINALYSIS DIPSTICK
Bilirubin, UA: NEGATIVE
Blood, UA: NEGATIVE
GLUCOSE UA: NEGATIVE
LEUKOCYTES UA: NEGATIVE
Nitrite, UA: NEGATIVE
PROTEIN UA: NEGATIVE
SPEC GRAV UA: 1.025
UROBILINOGEN UA: 0.2
pH, UA: 6

## 2014-10-07 IMAGING — CR DG CHEST 1V PORT
1 series · 1 of 1 positions shown · non-contrast
Comparison: None.

CLINICAL DATA: Status post bronchoscopy

PORTABLE CHEST - 1 VIEW

[AP]
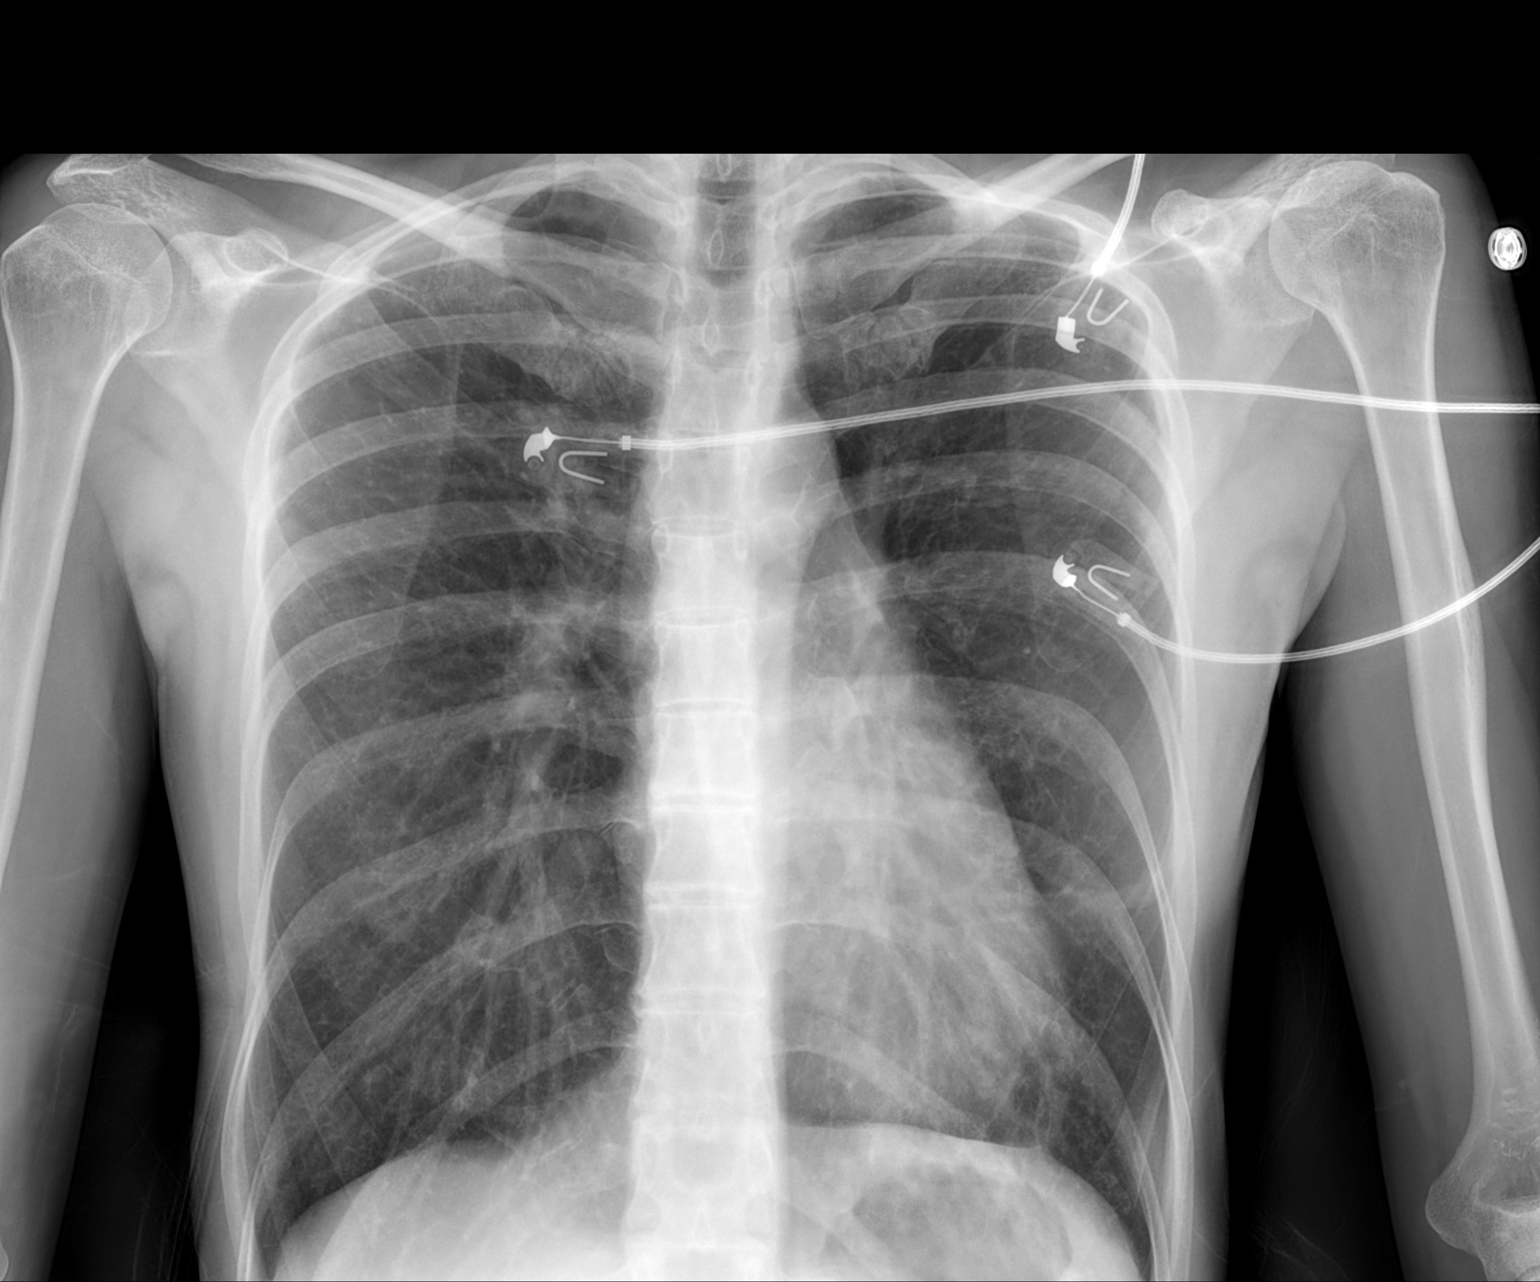

[1 of 1 positions shown; findings below may reference images not displayed]

FINDINGS: The heart and pulmonary vascularity are within normal
limits.  The lungs are well-aerated.  No pneumothorax is noted
following bronchoscopy.  No focal infiltrate is seen.
IMPRESSION: No evidence of post bronchoscopy pneumothorax.

## 2014-11-11 ENCOUNTER — Telehealth: Payer: Self-pay

## 2014-11-11 NOTE — Telephone Encounter (Signed)
11/11/14 Disc Received from [facility] and filed on shelf./IH °

## 2014-12-02 IMAGING — CR DG THORACIC SPINE 3V
3 series · 3 of 3 positions shown · non-contrast
Comparison: 04/16/2013

CLINICAL DATA: Pain and rib deformity.

EXAM:
THORACIC SPINE - 2 VIEW + SWIMMERS

[w t-spine a.p. *]
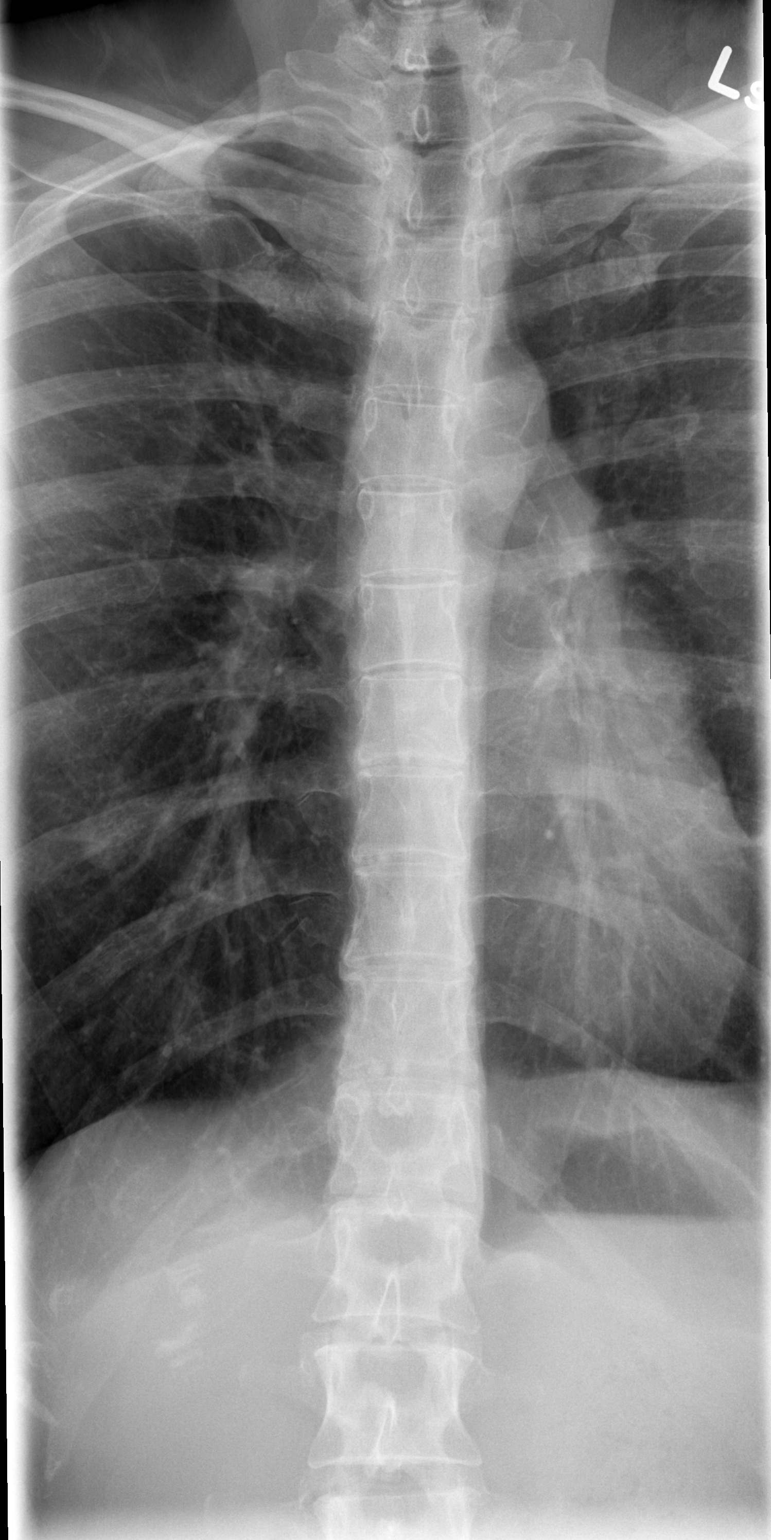

[w t-spine lat *]
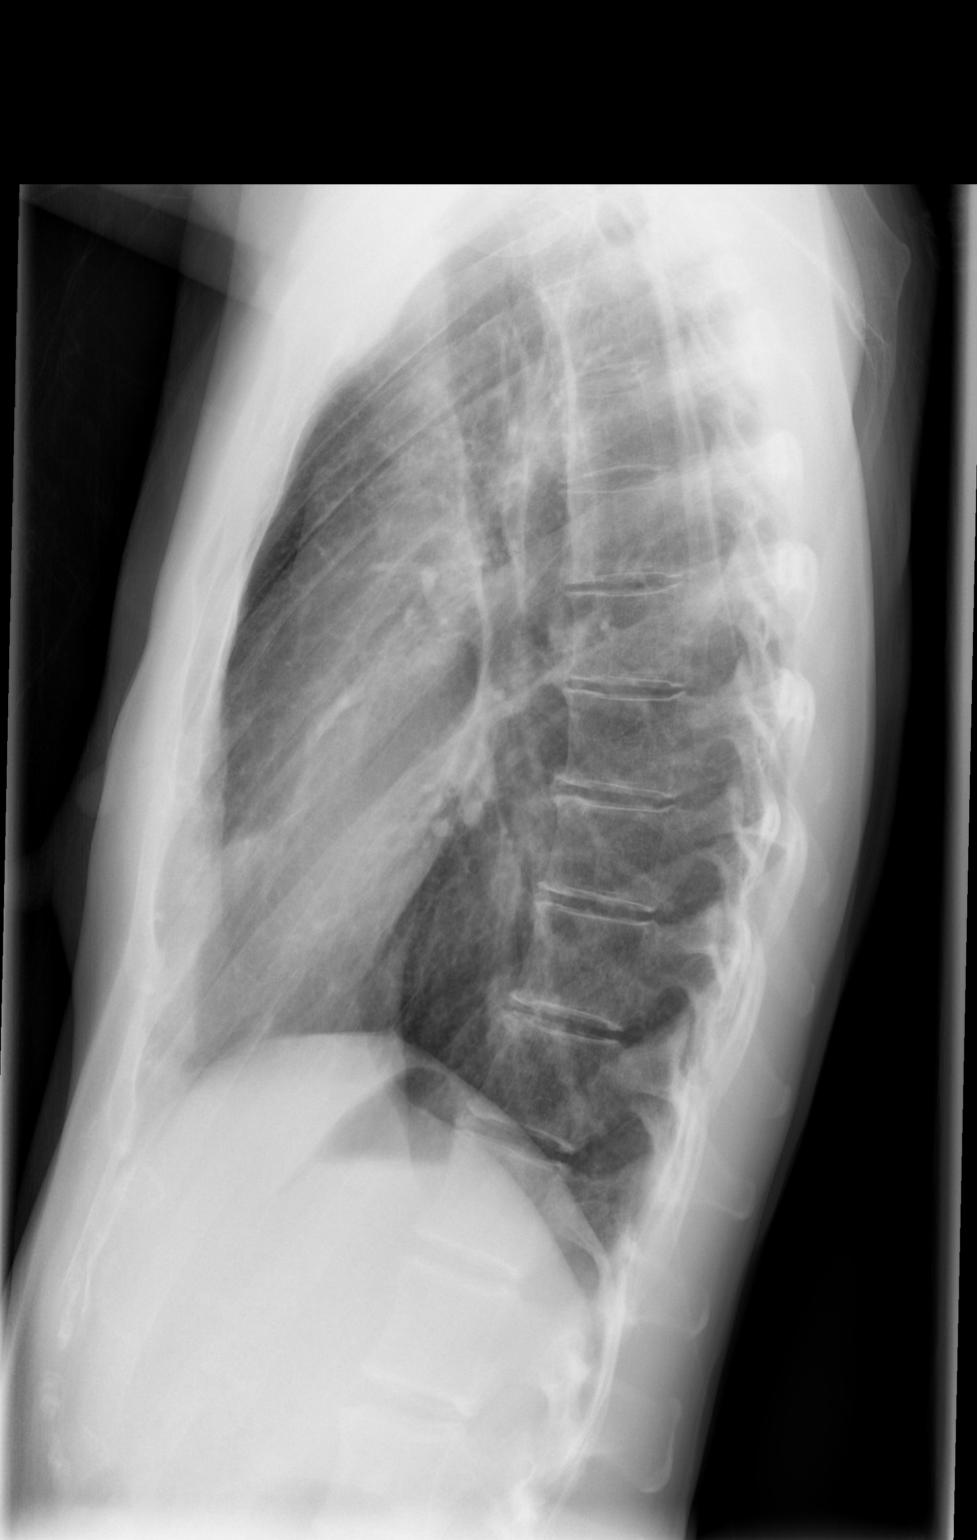

[w swimmers view]
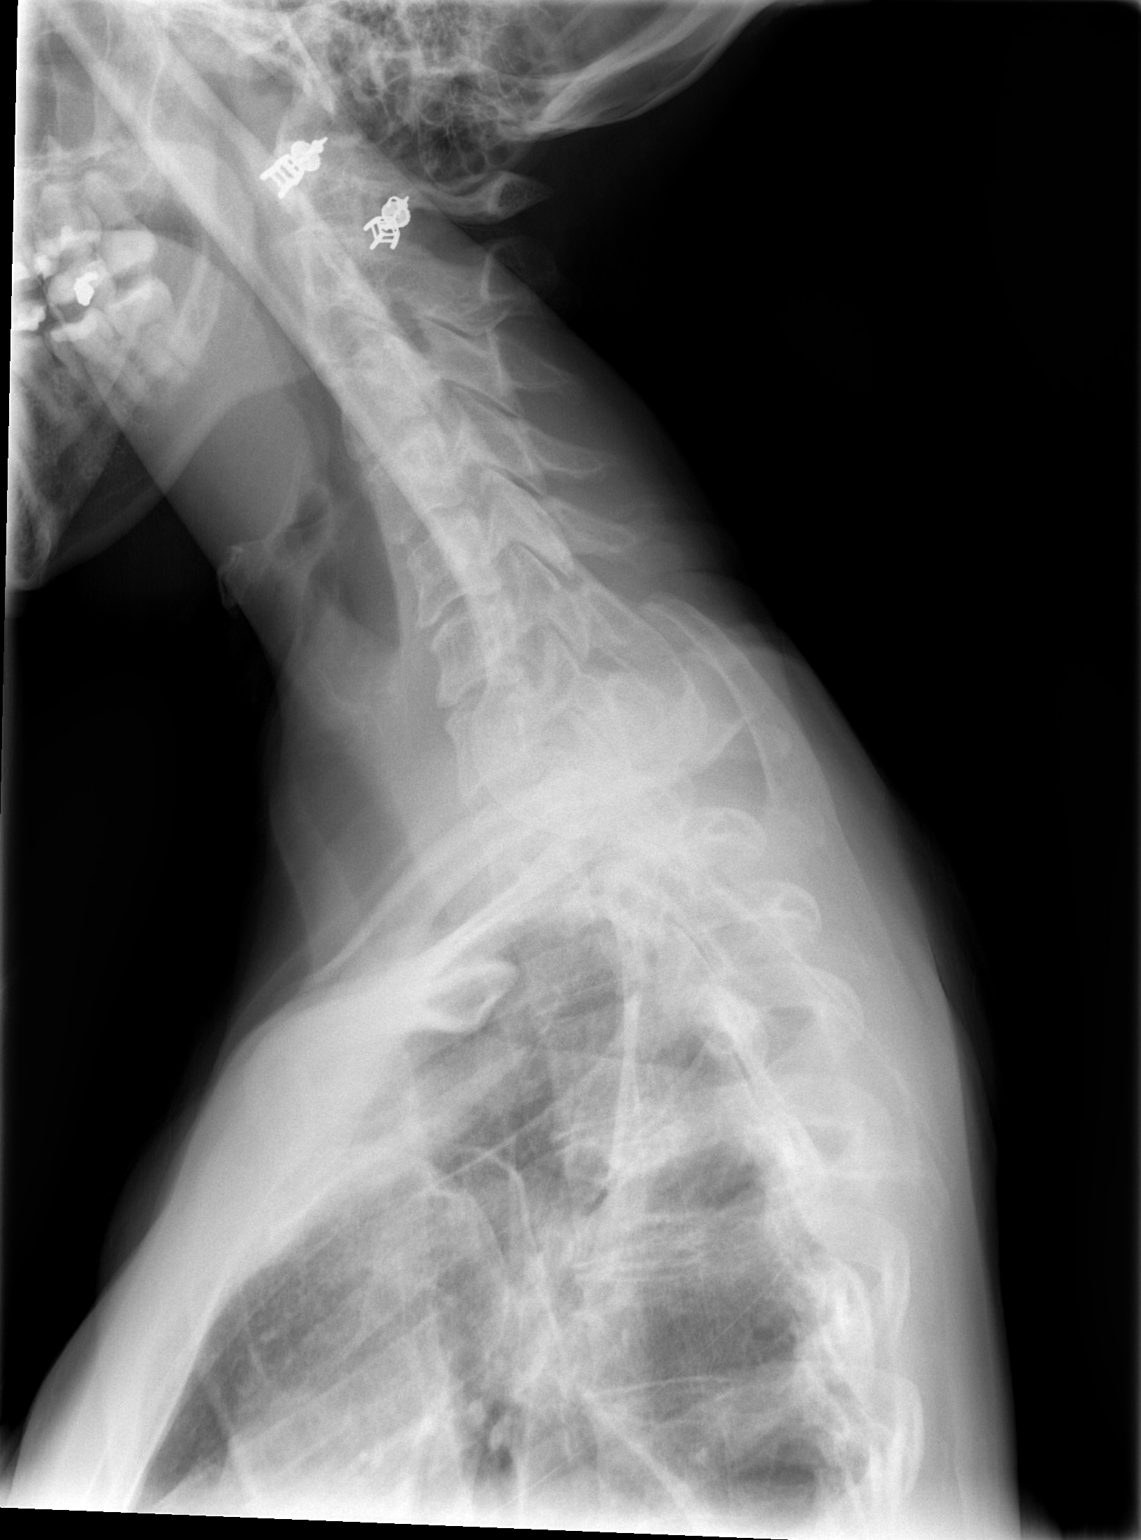

[3 of 3 positions shown; findings below may reference images not displayed]

FINDINGS: Mild thoracic spondylosis. Otherwise negative.
IMPRESSION: 1. Mild thoracic spondylosis.

## 2015-04-20 LAB — HM PAP SMEAR: HM Pap smear: NORMAL

## 2015-04-20 LAB — HM MAMMOGRAPHY

## 2015-11-14 ENCOUNTER — Encounter: Payer: Self-pay | Admitting: Family Medicine

## 2015-11-14 ENCOUNTER — Ambulatory Visit (INDEPENDENT_AMBULATORY_CARE_PROVIDER_SITE_OTHER): Payer: Self-pay | Admitting: Family Medicine

## 2015-11-14 VITALS — BP 126/82 | HR 89 | Temp 98.1°F | Resp 20 | Wt 115.2 lb

## 2015-11-14 DIAGNOSIS — J4520 Mild intermittent asthma, uncomplicated: Secondary | ICD-10-CM | POA: Diagnosis not present

## 2015-11-14 DIAGNOSIS — R062 Wheezing: Secondary | ICD-10-CM

## 2015-11-14 MED ORDER — PREDNISONE 20 MG PO TABS
ORAL_TABLET | ORAL | Status: DC
Start: 2015-11-14 — End: 2017-05-08

## 2015-11-14 MED ORDER — ALBUTEROL SULFATE HFA 108 (90 BASE) MCG/ACT IN AERS: 2.0000 | INHALATION_SPRAY | Freq: Four times a day (QID) | RESPIRATORY_TRACT | Status: DC | PRN

## 2015-11-14 NOTE — Progress Notes (Signed)
Patient ID: Jessica Osborne, female   DOB: 04/28/1976, 40 y.o.   MRN: [id number]    Jessica Osborne , 04/25/1976, 40 y.o., female MRN: [id number]  CC: pain in Lung Subjective: Pt presents for an acute OV with complaints of chest discomfort of  1 week duration. Associated symptoms include fever, aches, sensation in left lung. She was seen in the UC and was Flu negative at that time. Provider heard a lung sounds and stated   she likely had pneumonia. No CXR was obtained. Doxycyline prescribed. Fever and chills stopped after abx started. She has had improvement but she remains tired, still has wheeze feeling in her LLL.She also states her left shoulder is bothering her. She feels this is msk in nature because when her husband rubs her shoulder it was tender. Laying down feels fine, but when sitting up the discomfort in her lungs and shoulder occur.  Patient has a history of mild asthma that mostly affects her during pregnancy and illness. She does not have an Inhaler currently. She reports her daughter came home form school Friday and had flu-like symptoms.   Allergies  Allergen Reactions  . Penicillins Rash   Social History  Substance Use Topics  . Smoking status: Never Smoker   . Smokeless tobacco: Never Used  . Alcohol Use: Yes     Comment: Occ   Past Medical History  Diagnosis Date  . Chicken pox   . Urinary tract bacterial infections   . Miscarriage    Past Surgical History  Procedure Laterality Date  . Tonsillectomy    . Video bronchoscopy Bilateral 04/16/2013    Procedure: VIDEO BRONCHOSCOPY WITH FLUORO;  Surgeon: Leslye Peerobert S Byrum, MD;  Location: [facility];  Service: Cardiopulmonary;  Laterality: Bilateral;   Family History  Problem Relation Age of Onset  . Melanoma Maternal Aunt   . Asthma Brother   . Allergies Brother   . Rheum arthritis Mother   . Arthritis Mother     rheumatoid  . Lupus Mother   . Diabetes Mother   . Stroke Maternal Grandfather   . Alcohol abuse Paternal  Grandfather   . Heart disease Paternal Grandfather   . Heart disease Maternal Grandmother   . Alcohol abuse Paternal Grandmother      Medication List       This list is accurate as of: 11/14/15  2:59 PM.  Always use your most recent med list.               albuterol 108 (90 Base) MCG/ACT inhaler  Commonly known as:  PROVENTIL HFA;VENTOLIN HFA  Inhale 2 puffs into the lungs every 6 (six) hours as needed for wheezing. Reported on 11/14/2015     cholecalciferol 1000 units tablet  Commonly known as:  VITAMIN D  Take 1,000 Units by mouth daily.     doxycycline 100 MG capsule  Commonly known as:  VIBRAMYCIN     MULTIVITAMIN PO  Take by mouth daily.       ROS: Negative, with the exception of above mentioned in HPI Objective:  BP 126/82 mmHg  Pulse 89  Temp(Src) 98.1 F (36.7 C)  Resp 20  Wt 115 lb 4 oz (52.277 kg)  SpO2 98%  LMP 11/12/2015 Body mass index is 19.18 kg/(m^2). Gen: Afebrile. No acute distress. Non-toxic in appearance, well developed, well nourished, female. Pleasant.  HENT: AT. [place]. Bilateral TM visualized, with bilateral air fluid levels. No erythema or bulging. MMM, no oral lesions. Bilateral nares with  erythema. Throat without erythema or exudates. No cough on exam, hoarseness present.  Eyes:Pupils Equal Round Reactive to light, Extraocular movements intact,  Conjunctiva without redness, discharge or icterus. Neck/lymp/endocrine: Supple, No lymphadenopathy CV: RRR  Chest: LLL inspiraory wheeze, otherwise CTAB. Abd: Soft. NTND. BS present  MSK: No erythema, left shoulder FROM.   Skin: No rashes, purpura or petechiae.  Neuro: Normal gait. PERLA. EOMi. Alert. Oriented x3  Psych: Normal affect, dress and demeanor. Normal speech. Normal thought content and judgment..   Assessment/Plan: Jessica Osborne is a 40 y.o. female present for acute OV for  Asthma, mild intermittent, uncomplicated/wheezing - Pt diagnosed as probable pneumonia bu UC ans started on doxy.  Audible inspiratory wheeze LLL on exam.  - continue doxy to completion. - added prednisone taper - Albuterol inhaler refilled for PRN use.  - predniSONE (DELTASONE) 20 MG tablet; 60 mg x3d, 40 mg x3d, 20 mg x2d, 10 mg x2d  Dispense: 18 tablet; Refill: 0 - DG Chest 2 View; Future - Uncertain etiology of shoulder discomfort, ? diaphragmatic irritation/referred pain. Will reeval after prednisone.   electronically signed by:  Felix Pacini, DO  [facility] - [place]

## 2015-11-14 NOTE — Patient Instructions (Signed)

## 2015-11-15 ENCOUNTER — Telehealth: Payer: Self-pay | Admitting: Family Medicine

## 2015-11-15 ENCOUNTER — Ambulatory Visit (INDEPENDENT_AMBULATORY_CARE_PROVIDER_SITE_OTHER): Payer: BLUE CROSS/BLUE SHIELD

## 2015-11-15 DIAGNOSIS — R062 Wheezing: Secondary | ICD-10-CM | POA: Diagnosis not present

## 2015-11-15 NOTE — Telephone Encounter (Signed)
Left message on patient voice mail with xray results and instructions.

## 2015-11-15 NOTE — Telephone Encounter (Signed)
Please call pt: - Her cxr is normal (lungs and ribs) - continue doxy to completion. - added prednisone taper - Albuterol inhaler refilled for PRN use.  - predniSONE (DELTASONE) 20 MG tablet; 60 mg x3d, 40 mg x3d, 20 mg x2d, 10 mg x2d Dispense: 18 tablet; Refill: 0

## 2017-05-08 ENCOUNTER — Encounter: Payer: Self-pay | Admitting: *Deleted

## 2017-05-08 ENCOUNTER — Telehealth: Payer: Self-pay | Admitting: Family Medicine

## 2017-05-08 ENCOUNTER — Encounter: Payer: Self-pay | Admitting: Family Medicine

## 2017-05-08 ENCOUNTER — Ambulatory Visit (INDEPENDENT_AMBULATORY_CARE_PROVIDER_SITE_OTHER): Payer: BLUE CROSS/BLUE SHIELD | Admitting: Family Medicine

## 2017-05-08 VITALS — BP 120/80 | HR 75 | Temp 98.0°F | Resp 18 | Ht 65.0 in | Wt 122.0 lb

## 2017-05-08 DIAGNOSIS — F419 Anxiety disorder, unspecified: Secondary | ICD-10-CM | POA: Diagnosis not present

## 2017-05-08 DIAGNOSIS — Z6820 Body mass index (BMI) 20.0-20.9, adult: Secondary | ICD-10-CM

## 2017-05-08 DIAGNOSIS — R5383 Other fatigue: Secondary | ICD-10-CM | POA: Insufficient documentation

## 2017-05-08 DIAGNOSIS — Z13220 Encounter for screening for lipoid disorders: Secondary | ICD-10-CM

## 2017-05-08 DIAGNOSIS — Z131 Encounter for screening for diabetes mellitus: Secondary | ICD-10-CM

## 2017-05-08 DIAGNOSIS — Z8249 Family history of ischemic heart disease and other diseases of the circulatory system: Secondary | ICD-10-CM | POA: Diagnosis not present

## 2017-05-08 DIAGNOSIS — Z0000 Encounter for general adult medical examination without abnormal findings: Secondary | ICD-10-CM

## 2017-05-08 DIAGNOSIS — J4520 Mild intermittent asthma, uncomplicated: Secondary | ICD-10-CM | POA: Diagnosis not present

## 2017-05-08 MED ORDER — HYDROXYZINE HCL 10 MG PO TABS
10.0000 mg | ORAL_TABLET | Freq: Two times a day (BID) | ORAL | 1 refills | Status: DC | PRN
Start: 2017-05-08 — End: 2017-09-06

## 2017-05-08 MED ORDER — ALBUTEROL SULFATE HFA 108 (90 BASE) MCG/ACT IN AERS: 2.0000 | INHALATION_SPRAY | Freq: Four times a day (QID) | RESPIRATORY_TRACT | 2 refills | Status: DC | PRN

## 2017-05-08 NOTE — Progress Notes (Signed)
Patient ID: Jessica Osborne, female  DOB: 07-20-1976, 41 y.o.   MRN: [id number] Patient Care Team    Relationship Specialty Notifications Start End  Ma Hillock, DO PCP - General Family Medicine  05/02/17   Collene Gobble, MD Consulting Physician Pulmonary Disease  06/30/13     Chief Complaint  Patient presents with  . Annual Exam    Subjective:  Jessica Osborne is a 41 y.o.  Female  present for CPE. All past medical history, surgical history, allergies, family history, immunizations, medications and social history were obtained and updated in the electronic medical record today. All recent labs, ED visits and hospitalizations within the last year were reviewed.  Pts states she does have occasional anxiety around bedtime. It is only a handful of times  times a year and usually when she is away from home. It is uncomfortable enough she has considered not going on trips. She feels like she is suffocating or can not move, and becomes very anxious. She had taken a medication for this a long time ago, but she does not recall the name (thinks maybe it was lexapro). She only took the medicine when needed, not daily. She also endorses more fatigue lasting over the past few months. She states she usually sleeps well, with the exception on when she is on trips . She intermittently takes vit d 1000 and multi-V but admits not routinely.   Health maintenance:  Colonoscopy: No fhx, screen at 48 Mammogram: completed:2016, GYN, no report available.  Cervical cancer screening: last pap: 2016, results: unknown, completed by: Dr. Tiburcio Bash Immunizations: tdap 2014, Influenza declined (encouraged yearly) Infectious disease screening: HIV completed DEXA:NA Assistive device: none Oxygen use none Patient has a Dental home. Hospitalizations/ED visits: none  Depression screen [facility] 2/9 05/08/2017  Decreased Interest 0  Down, Depressed, Hopeless 0  PHQ - 2 Score 0   No flowsheet data  found.   Current Exercise Habits: Home exercise routine, Type of exercise: yoga;walking, Time (Minutes): 30, Frequency (Times/Week): 5, Weekly Exercise (Minutes/Week): 150, Intensity: Moderate     Immunization History  Administered Date(s) Administered  . Influenza,inj,Quad PF,6+ Mos 05/21/2013     Past Medical History:  Diagnosis Date  . Chicken pox   . Miscarriage   . Urinary tract bacterial infections    Allergies  Allergen Reactions  . Penicillins Rash   Past Surgical History:  Procedure Laterality Date  . TONSILLECTOMY    . VIDEO BRONCHOSCOPY Bilateral 04/16/2013   Procedure: VIDEO BRONCHOSCOPY WITH FLUORO;  Surgeon: Collene Gobble, MD;  Location: [facility];  Service: Cardiopulmonary;  Laterality: Bilateral;   Family History  Problem Relation Age of Onset  . Asthma Brother   . Allergies Brother   . Rheum arthritis Mother   . Arthritis Mother        rheumatoid  . Lupus Mother   . Diabetes Mother   . Stroke Maternal Grandfather   . Alcohol abuse Paternal Grandfather   . Heart disease Paternal Grandfather   . Heart disease Father   . Heart attack Father   . Heart disease Maternal Grandmother   . Alcohol abuse Paternal Grandmother   . Melanoma Maternal Aunt    Social History   Social History  . Marital status: Married    Spouse name: N/A  . Number of children: 2  . Years of education: bachelors   Occupational History  . stay at home mother   .  Not Employed  Social History Main Topics  . Smoking status: Never Smoker  . Smokeless tobacco: Never Used  . Alcohol use Yes     Comment: Occ  . Drug use: No  . Sexual activity: Yes    Birth control/ protection: IUD   Other Topics Concern  . Not on file   Social History Narrative   Ms. Fong is married, lives with her husband & 2 young children. She works in the home.    Allergies as of 05/08/2017      Reactions   Penicillins Rash      Medication List       Accurate as of 05/08/17 11:24 AM.  Always use your most recent med list.          albuterol 108 (90 Base) MCG/ACT inhaler Commonly known as:  PROVENTIL HFA;VENTOLIN HFA Inhale 2 puffs into the lungs every 6 (six) hours as needed for wheezing. Reported on 11/14/2015   cholecalciferol 1000 units tablet Commonly known as:  VITAMIN D Take 1,000 Units by mouth daily.   hydrOXYzine 10 MG tablet Commonly known as:  ATARAX/VISTARIL Take 1-3 tablets (10-30 mg total) by mouth 3 times/day as needed-between meals & bedtime.   MULTIVITAMIN PO Take by mouth daily.            Discharge Care Instructions        Start     Ordered   05/08/17 0000  albuterol (PROVENTIL HFA;VENTOLIN HFA) 108 (90 Base) MCG/ACT inhaler  Every 6 hours PRN     05/08/17 0944   05/08/17 0000  hydrOXYzine (ATARAX/VISTARIL) 10 MG tablet  2 times daily between meals and at bedtime PRN     05/08/17 0948   05/08/17 0000  CBC     05/08/17 1115   05/08/17 0000  Comp Met (CMET)     05/08/17 1115   05/08/17 0000  TSH     05/08/17 1115   05/08/17 0000  Vitamin D (25 hydroxy)     05/08/17 1115   05/08/17 0000  Hemoglobin A1c     05/08/17 1115   05/08/17 0000  Lipid panel     05/08/17 1115      All past medical history, surgical history, allergies, family history, immunizations andmedications were updated in the EMR today and reviewed under the history and medication portions of their EMR.     No results found for this or any previous visit (from the past 2160 hour(s)).  ROS: 14 pt review of systems performed and negative (unless mentioned in an HPI)  Objective: BP 120/80 (BP Location: Left Arm, Patient Position: Sitting, Cuff Size: Normal)   Pulse 75   Temp 98 F (36.7 C)   Resp 18   Ht 5' 5"  (1.651 m)   Wt 122 lb (55.3 kg)   LMP 05/01/2017   SpO2 98%   BMI 20.30 kg/m  Gen: Afebrile. No acute distress. Nontoxic in appearance, well-developed, well-nourished,  pleasant caucasian female.  HENT: AT. [place]. Bilateral TM visualized and normal in  appearance, normal external auditory canal. MMM, no oral lesions, adequate dentition. Bilateral nares within normal limits. Throat without erythema, ulcerations or exudates. no Cough on exam, no hoarseness on exam. Eyes:Pupils Equal Round Reactive to light, Extraocular movements intact,  Conjunctiva without redness, discharge or icterus. Neck/lymp/endocrine: Supple,no lymphadenopathy, no thyromegaly CV: RRR no murmur, no edema, +2/4 P posterior tibialis pulses. no carotid bruits. No JVD. Chest: CTAB, no wheeze, rhonchi or crackles. normal Respiratory effort. good Air movement. Abd: Soft.  flat. NTND. BS present. no Masses palpated. No hepatosplenomegaly. No rebound tenderness or guarding. Skin: no rashes, purpura or petechiae. Warm and well-perfused. Skin intact. Neuro/Msk:  Normal gait. PERLA. EOMi. Alert. Oriented x3.  Cranial nerves II through XII intact. Muscle strength 5/5 upper/lower extremity. DTRs equal bilaterally. Psych: Normal affect, dress and demeanor. Normal speech. Normal thought content and judgment.  No exam data present  Assessment/plan: REYGAN HEAGLE is a 41 y.o. female present for CPE . Other fatigue - reports of more fatigue, despite good sleep. - Start multi-V. - CBC; Future - Comp Met (CMET); Future - TSH; Future - Vitamin D (25 hydroxy); Future Mild intermittent asthma without complication Stable. Only uses during colds. Refills on albuterol PRN.  Anxiety Mostly surrounding sleeping away from home, but significant enough to cause her not to want to go on trips. She does not want to be on a medicine daily.  - TSH; Future - Vistaril prescribed 10-30 mg QHS PRN.  Screening cholesterol level Significant Fhx heart disease/MI - Lipid panel; Future Diabetes mellitus screening - Hemoglobin A1c; Future Encounter for preventive health examination BMI 20.0-20.9, adult Patient was encouraged to exercise greater than 150 minutes a week. Patient was encouraged to choose a  diet filled with fresh fruits and vegetables, and lean meats. AVS provided to patient today for education/recommendation on gender specific health and safety maintenance. All desired immunizations are UTD. PAP & MAM through GYN, records requested. Pt encouraged to have mammogram at least q 2 years. She declined order today, she agreed to follow with GYN soon to make sure UTD.   Return in about 1 year (around 05/08/2018) for CPE.  Electronically signed by: Howard Pouch, DO [place]

## 2017-05-08 NOTE — Telephone Encounter (Signed)
Please call patient:  I have received her report from her gynecologist office on her Pap and mammogram completed in September 2016. - The mammogram report we received stated it was an incomplete mammogram and needed repeated secondary to incomplete visualization. I do not see where the repeat was completed. They may have had trouble getting hold of her, it states a certified letter was sent. Since this is incomplete, and no follow up was done, I suggest she get her mammogram soon.  - She wanted to wait until she saw Dr. Charlotte Crumb to have it completed there, and that is absolutely fine, but I suggest she make her appointment sooner than later.  If given the circumstances, she would like to have her mammogram completed now, I can place an order for her but we cannot place an order to be completed at Dr. Roseanne Kaufman office. She would have to have it at one of the other imaging or breast centers. Please advise.

## 2017-05-08 NOTE — Telephone Encounter (Signed)
Spoke with patient reviewed information. Patient states she doesn't remember getting a letter but she will call Dr Elpidio Eric office to set up appt for her Mammogram .

## 2017-05-08 NOTE — Patient Instructions (Signed)
Health Maintenance, Female Adopting a healthy lifestyle and getting preventive care can go a long way to promote health and wellness. Talk with your health care provider about what schedule of regular examinations is right for you. This is a good chance for you to check in with your provider about disease prevention and staying healthy. In between checkups, there are plenty of things you can do on your own. Experts have done a lot of research about which lifestyle changes and preventive measures are most likely to keep you healthy. Ask your health care provider for more information. Weight and diet Eat a healthy diet  Be sure to include plenty of vegetables, fruits, low-fat dairy products, and lean protein.  Do not eat a lot of foods high in solid fats, added sugars, or salt.  Get regular exercise. This is one of the most important things you can do for your health. ? Most adults should exercise for at least 150 minutes each week. The exercise should increase your heart rate and make you sweat (moderate-intensity exercise). ? Most adults should also do strengthening exercises at least twice a week. This is in addition to the moderate-intensity exercise.  Maintain a healthy weight  Body mass index (BMI) is a measurement that can be used to identify possible weight problems. It estimates body fat based on height and weight. Your health care provider can help determine your BMI and help you achieve or maintain a healthy weight.  For females 20 years of age and older: ? A BMI below 18.5 is considered underweight. ? A BMI of 18.5 to 24.9 is normal. ? A BMI of 25 to 29.9 is considered overweight. ? A BMI of 30 and above is considered obese.  Watch levels of cholesterol and blood lipids  You should start having your blood tested for lipids and cholesterol at 43 years of age, then have this test every 5 years.  You may need to have your cholesterol levels checked more often if: ? Your lipid or  cholesterol levels are high. ? You are older than 43 years of age. ? You are at high risk for heart disease.  Cancer screening Lung Cancer  Lung cancer screening is recommended for adults 55-80 years old who are at high risk for lung cancer because of a history of smoking.  A yearly low-dose CT scan of the lungs is recommended for people who: ? Currently smoke. ? Have quit within the past 15 years. ? Have at least a 30-pack-year history of smoking. A pack year is smoking an average of one pack of cigarettes a day for 1 year.  Yearly screening should continue until it has been 15 years since you quit.  Yearly screening should stop if you develop a health problem that would prevent you from having lung cancer treatment.  Breast Cancer  Practice breast self-awareness. This means understanding how your breasts normally appear and feel.  It also means doing regular breast self-exams. Let your health care provider know about any changes, no matter how small.  If you are in your 20s or 30s, you should have a clinical breast exam (CBE) by a health care provider every 1-3 years as part of a regular health exam.  If you are 40 or older, have a CBE every year. Also consider having a breast X-ray (mammogram) every year.  If you have a family history of breast cancer, talk to your health care provider about genetic screening.  If you are at high risk   for breast cancer, talk to your health care provider about having an MRI and a mammogram every year.  Breast cancer gene (BRCA) assessment is recommended for women who have family members with BRCA-related cancers. BRCA-related cancers include: ? Breast. ? Ovarian. ? Tubal. ? Peritoneal cancers.  Results of the assessment will determine the need for genetic counseling and BRCA1 and BRCA2 testing.  Cervical Cancer Your health care provider may recommend that you be screened regularly for cancer of the pelvic organs (ovaries, uterus, and  vagina). This screening involves a pelvic examination, including checking for microscopic changes to the surface of your cervix (Pap test). You may be encouraged to have this screening done every 3 years, beginning at age 22.  For women ages 56-65, health care providers may recommend pelvic exams and Pap testing every 3 years, or they may recommend the Pap and pelvic exam, combined with testing for human papilloma virus (HPV), every 5 years. Some types of HPV increase your risk of cervical cancer. Testing for HPV may also be done on women of any age with unclear Pap test results.  Other health care providers may not recommend any screening for nonpregnant women who are considered low risk for pelvic cancer and who do not have symptoms. Ask your health care provider if a screening pelvic exam is right for you.  If you have had past treatment for cervical cancer or a condition that could lead to cancer, you need Pap tests and screening for cancer for at least 20 years after your treatment. If Pap tests have been discontinued, your risk factors (such as having a new sexual partner) need to be reassessed to determine if screening should resume. Some women have medical problems that increase the chance of getting cervical cancer. In these cases, your health care provider may recommend more frequent screening and Pap tests.  Colorectal Cancer  This type of cancer can be detected and often prevented.  Routine colorectal cancer screening usually begins at 43 years of age and continues through 43 years of age.  Your health care provider may recommend screening at an earlier age if you have risk factors for colon cancer.  Your health care provider may also recommend using home test kits to check for hidden blood in the stool.  A small camera at the end of a tube can be used to examine your colon directly (sigmoidoscopy or colonoscopy). This is done to check for the earliest forms of colorectal  cancer.  Routine screening usually begins at age 33.  Direct examination of the colon should be repeated every 5-10 years through 43 years of age. However, you may need to be screened more often if early forms of precancerous polyps or small growths are found.  Skin Cancer  Check your skin from head to toe regularly.  Tell your health care provider about any new moles or changes in moles, especially if there is a change in a mole's shape or color.  Also tell your health care provider if you have a mole that is larger than the size of a pencil eraser.  Always use sunscreen. Apply sunscreen liberally and repeatedly throughout the day.  Protect yourself by wearing long sleeves, pants, a wide-brimmed hat, and sunglasses whenever you are outside.  Heart disease, diabetes, and high blood pressure  High blood pressure causes heart disease and increases the risk of stroke. High blood pressure is more likely to develop in: ? People who have blood pressure in the high end of  the normal range (130-139/85-89 mm Hg). ? People who are overweight or obese. ? People who are African American.  If you are 21-29 years of age, have your blood pressure checked every 3-5 years. If you are 3 years of age or older, have your blood pressure checked every year. You should have your blood pressure measured twice-once when you are at a hospital or clinic, and once when you are not at a hospital or clinic. Record the average of the two measurements. To check your blood pressure when you are not at a hospital or clinic, you can use: ? An automated blood pressure machine at a pharmacy. ? A home blood pressure monitor.  If you are between 17 years and 37 years old, ask your health care provider if you should take aspirin to prevent strokes.  Have regular diabetes screenings. This involves taking a blood sample to check your fasting blood sugar level. ? If you are at a normal weight and have a low risk for diabetes,  have this test once every three years after 43 years of age. ? If you are overweight and have a high risk for diabetes, consider being tested at a younger age or more often. Preventing infection Hepatitis B  If you have a higher risk for hepatitis B, you should be screened for this virus. You are considered at high risk for hepatitis B if: ? You were born in a country where hepatitis B is common. Ask your health care provider which countries are considered high risk. ? Your parents were born in a high-risk country, and you have not been immunized against hepatitis B (hepatitis B vaccine). ? You have HIV or AIDS. ? You use needles to inject street drugs. ? You live with someone who has hepatitis B. ? You have had sex with someone who has hepatitis B. ? You get hemodialysis treatment. ? You take certain medicines for conditions, including cancer, organ transplantation, and autoimmune conditions.  Hepatitis C  Blood testing is recommended for: ? Everyone born from [birth year] through 1967. ? Anyone with known risk factors for hepatitis C.  Sexually transmitted infections (STIs)  You should be screened for sexually transmitted infections (STIs) including gonorrhea and chlamydia if: ? You are sexually active and are younger than 43 years of age. ? You are older than 43 years of age and your health care provider tells you that you are at risk for this type of infection. ? Your sexual activity has changed since you were last screened and you are at an increased risk for chlamydia or gonorrhea. Ask your health care provider if you are at risk.  If you do not have HIV, but are at risk, it may be recommended that you take a prescription medicine daily to prevent HIV infection. This is called pre-exposure prophylaxis (PrEP). You are considered at risk if: ? You are sexually active and do not regularly use condoms or know the HIV status of your partner(s). ? You take drugs by injection. ? You are  sexually active with a partner who has HIV.  Talk with your health care provider about whether you are at high risk of being infected with HIV. If you choose to begin PrEP, you should first be tested for HIV. You should then be tested every 3 months for as long as you are taking PrEP. Pregnancy  If you are premenopausal and you may become pregnant, ask your health care provider about preconception counseling.  If you may become  pregnant, take 400 to 800 micrograms (mcg) of folic acid every day.  If you want to prevent pregnancy, talk to your health care provider about birth control (contraception). Osteoporosis and menopause  Osteoporosis is a disease in which the bones lose minerals and strength with aging. This can result in serious bone fractures. Your risk for osteoporosis can be identified using a bone density scan.  If you are 65 years of age or older, or if you are at risk for osteoporosis and fractures, ask your health care provider if you should be screened.  Ask your health care provider whether you should take a calcium or vitamin D supplement to lower your risk for osteoporosis.  Menopause may have certain physical symptoms and risks.  Hormone replacement therapy may reduce some of these symptoms and risks. Talk to your health care provider about whether hormone replacement therapy is right for you. Follow these instructions at home:  Schedule regular health, dental, and eye exams.  Stay current with your immunizations.  Do not use any tobacco products including cigarettes, chewing tobacco, or electronic cigarettes.  If you are pregnant, do not drink alcohol.  If you are breastfeeding, limit how much and how often you drink alcohol.  Limit alcohol intake to no more than 1 drink per day for nonpregnant women. One drink equals 12 ounces of beer, 5 ounces of wine, or 1 ounces of hard liquor.  Do not use street drugs.  Do not share needles.  Ask your health care  provider for help if you need support or information about quitting drugs.  Tell your health care provider if you often feel depressed.  Tell your health care provider if you have ever been abused or do not feel safe at home. This information is not intended to replace advice given to you by your health care provider. Make sure you discuss any questions you have with your health care provider. Document Released: 02/12/2011 Document Revised: 01/05/2016 Document Reviewed: 05/03/2015 Elsevier Interactive Patient Education  2018 [employer]  Please help us help you:  We are honored you have chosen [facility] for your Primary Care home. Below you will find basic instructions that you may need to access in the future. Please help us help you by reading the instructions, which cover many of the frequent questions we experience.   Prescription refills and request:  -In order to allow more efficient response time, please call your pharmacy for all refills. They will forward the request electronically to us. This allows for the quickest possible response. Request left on a nurse line can take longer to refill, since these are checked as time allows between office patients and other phone calls.  - refill request can take up to 3-5 working days to complete.  - If request is sent electronically and request is appropiate, it is usually completed in 1-2 business days.  - all patients will need to be seen routinely for all chronic medical conditions requiring prescription medications (see follow-up below). If you are overdue for follow up on your condition, you will be asked to make an appointment and we will call in enough medication to cover you until your appointment (up to 30 days).  - all controlled substances will require a face to face visit to request/refill.  - if you desire your prescriptions to go through a new pharmacy, and have an active script at original pharmacy, you will need to call  your pharmacy and have scripts transferred to new   new pharmacy. This is completed between the pharmacy locations and not by your provider.    Results: If any images or labs were ordered, it can take up to 1 week to get results depending on the test ordered and the lab/facility running and resulting the test. - Normal or stable results, which do not need further discussion, may be released to your mychart immediately with attached note to you. A call may not be generated for normal results. Please make certain to sign up for mychart. If you have questions on how to activate your mychart you can call the front office.  - If your results need further discussion, our office will attempt to contact you via phone, and if unable to reach you after 2 attempts, we will release your abnormal result to your mychart with instructions.  - All results will be automatically released in mychart after 1 week.  - Your provider will provide you with explanation and instruction on all relevant material in your results. Please keep in mind, results and labs may appear confusing or abnormal to the untrained eye, but it does not mean they are actually abnormal for you personally. If you have any questions about your results that are not covered, or you desire more detailed explanation than what was provided, you should make an appointment with your provider to do so.   Our office handles many outgoing and incoming calls daily. If we have not contacted you within 1 week about your results, please check your mychart to see if there is a message first and if not, then contact our office.  In helping with this matter, you help decrease call volume, and therefore allow us to be able to respond to patients needs more efficiently.   Acute office visits (sick visit):  An acute visit is intended for a new problem and are scheduled in shorter time slots to allow schedule openings for patients with new problems. This is the appropriate visit  to discuss a new problem. In order to provide you with excellent quality medical care with proper time for you to explain your problem, have an exam and receive treatment with instructions, these appointments should be limited to one new problem per visit. If you experience a new problem, in which you desire to be addressed, please make an acute office visit, we save openings on the schedule to accommodate you. Please do not save your new problem for any other type of visit, let us take care of it properly and quickly for you.   Follow up visits:  Depending on your condition(s) your provider will need to see you routinely in order to provide you with quality care and prescribe medication(s). Most chronic conditions (Example: hypertension, Diabetes, depression/anxiety... etc), require visits a couple times a year. Your provider will instruct you on proper follow up for your personal medical conditions and history. Please make certain to make follow up appointments for your condition as instructed. Failing to do so could result in lapse in your medication treatment/refills. If you request a refill, and are overdue to be seen on a condition, we will always provide you with a 30 day script (once) to allow you time to schedule.    Medicare wellness (well visit): - we have a wonderful Nurse (Kim), that will meet with you and provide you will yearly medicare wellness visits. These visits should occur yearly (can not be scheduled less than 1 calendar year apart) and cover preventive health, immunizations, advance directives and   you are entitled to yearly through your medicare benefits. Do not miss out on your entitled benefits, this is when medicare will pay for these benefits to be ordered for you.  These are strongly encouraged by your provider and is the appropriate type of visit to make certain you are up to date with all preventive health benefits. If you have not had your medicare wellness exam in  the last 12 months, please make certain to schedule one by calling the office and schedule your medicare wellness with Maudie Mercury as soon as possible.   Yearly physical (well visit):  - Adults are recommended to be seen yearly for physicals. Check with your insurance and date of your last physical, most insurances require one calendar year between physicals. Physicals include all preventive health topics, screenings, medical exam and labs that are appropriate for gender/age and history. You may have fasting labs needed at this visit. This is a well visit (not a sick visit), new problems should not be covered during this visit (see acute visit).  - Pediatric patients are seen more frequently when they are younger. Your provider will advise you on well child visit timing that is appropriate for your their age. - This is not a medicare wellness visit. Medicare wellness exams do not have an exam portion to the visit. Some medicare companies allow for a physical, some do not allow a yearly physical. If your medicare allows a yearly physical you can schedule the medicare wellness with our nurse Maudie Mercury and have your physical with your provider after, on the same day. Please check with insurance for your full benefits.   Late Policy/No Shows:  - all new patients should arrive 15-30 minutes earlier than appointment to allow [place] time  to  obtain all personal demographics,  insurance information and for you to complete office paperwork. - All established patients should arrive 10-15 minutes earlier than appointment time to update all information and be checked in .  - In our best efforts to run on time, if you are late for your appointment you will be asked to either reschedule or if able, we will work you back into the schedule. There will be a wait time to work you back in the schedule,  depending on availability.  - If you are unable to make it to your appointment as scheduled, please call 24 hours ahead of time to allow [place]  to fill the time slot with someone else who needs to be seen. If you do not cancel your appointment ahead of time, you may be charged a no show fee.   Future lab appt, fasting 9 hours, water only, within a week.  Start vistaril  10-30 mg at night to help with anxiety and sleep.  Albuterol also called in for you.

## 2017-05-10 ENCOUNTER — Other Ambulatory Visit: Payer: BLUE CROSS/BLUE SHIELD

## 2017-07-10 ENCOUNTER — Ambulatory Visit: Payer: BLUE CROSS/BLUE SHIELD | Admitting: Family Medicine

## 2017-07-10 ENCOUNTER — Telehealth: Payer: Self-pay

## 2017-07-10 NOTE — Telephone Encounter (Signed)
Spoke with patient regarding symptoms (scheduled for "heart burns").  Patient reports heart burn with increased belching x 2 weeks. Denies chest pressure or SOB. Has been taking OTC antacids with some relief.  Patient scheduled to see PCP on Monday, 07/15/17 @ 0830.

## 2017-07-15 ENCOUNTER — Ambulatory Visit: Payer: BLUE CROSS/BLUE SHIELD | Admitting: Family Medicine

## 2017-07-15 ENCOUNTER — Encounter: Payer: Self-pay | Admitting: Family Medicine

## 2017-07-15 VITALS — BP 107/73 | HR 81 | Temp 97.9°F | Ht 65.0 in | Wt 123.0 lb

## 2017-07-15 DIAGNOSIS — K219 Gastro-esophageal reflux disease without esophagitis: Secondary | ICD-10-CM | POA: Diagnosis not present

## 2017-07-15 DIAGNOSIS — F419 Anxiety disorder, unspecified: Secondary | ICD-10-CM

## 2017-07-15 MED ORDER — ESCITALOPRAM OXALATE 10 MG PO TABS: 10.0000 mg | ORAL_TABLET | Freq: Every day | ORAL | 0 refills | Status: DC

## 2017-07-15 MED ORDER — OMEPRAZOLE 20 MG PO CPDR
DELAYED_RELEASE_CAPSULE | ORAL | 0 refills | Status: DC
Start: 2017-07-15 — End: 2017-07-29

## 2017-07-15 NOTE — Patient Instructions (Signed)
Heartburn Heartburn is a type of pain or discomfort that can happen in the throat or chest. It is often described as a burning pain. It may also cause a bad taste in the mouth. Heartburn may feel worse when you lie down or bend over, and it is often worse at night. Heartburn may be caused by stomach contents that move back up into the esophagus (reflux). Follow these instructions at home: Take these actions to decrease your discomfort and to help avoid complications. Diet   Follow a diet as recommended by your health care provider. This may involve avoiding foods and drinks such as:  Coffee and tea (with or without caffeine).  Drinks that contain alcohol.  Energy drinks and sports drinks.  Carbonated drinks or sodas.  Chocolate and cocoa.  Peppermint and mint flavorings.  Garlic and onions.  Horseradish.  Spicy and acidic foods, including peppers, chili powder, curry powder, vinegar, hot sauces, and barbecue sauce.  Citrus fruit juices and citrus fruits, such as oranges, lemons, and limes.  Tomato-based foods, such as red sauce, chili, salsa, and pizza with red sauce.  Fried and fatty foods, such as donuts, french fries, potato chips, and high-fat dressings.  High-fat meats, such as hot dogs and fatty cuts of red and white meats, such as rib eye steak, sausage, ham, and bacon.  High-fat dairy items, such as whole milk, butter, and cream cheese.  Eat small, frequent meals instead of large meals.  Avoid drinking large amounts of liquid with your meals.  Avoid eating meals during the 2-3 hours before bedtime.  Avoid lying down right after you eat.  Do not exercise right after you eat. General instructions   Pay attention to any changes in your symptoms.  Take over-the-counter and prescription medicines only as told by your health care provider. Do not take aspirin, ibuprofen, or other NSAIDs unless your health care provider told you to do so.  Do not use any tobacco  products, including cigarettes, chewing tobacco, and e-cigarettes. If you need help quitting, ask your health care provider.  Wear loose-fitting clothing. Do not wear anything tight around your waist that causes pressure on your abdomen.  Raise (elevate) the head of your bed about 6 inches (15 cm).  Try to reduce your stress, such as with yoga or meditation. If you need help reducing stress, ask your health care provider.  If you are overweight, reduce your weight to an amount that is healthy for you. Ask your health care provider for guidance about a safe weight loss goal.  Keep all follow-up visits as told by your health care provider. This is important. Contact a health care provider if:  You have new symptoms.  You have unexplained weight loss.  You have difficulty swallowing, or it hurts to swallow.  You have wheezing or a persistent cough.  Your symptoms do not improve with treatment.  You have frequent heartburn for more than two weeks. Get help right away if:  You have pain in your arms, neck, jaw, teeth, or back.  You feel sweaty, dizzy, or light-headed.  You have chest pain or shortness of breath.  You vomit and your vomit looks like blood or coffee grounds.  Your stool is bloody or black. This information is not intended to replace advice given to you by your health care provider. Make sure you discuss any questions you have with your health care provider. Document Released: 12/16/2008 Document Revised: 01/05/2016 Document Reviewed: 11/24/2014 Elsevier Interactive Patient Education    2017 Elsevier Inc.   Food Choices for Gastroesophageal Reflux Disease, Adult When you have gastroesophageal reflux disease (GERD), the foods you eat and your eating habits are very important. Choosing the right foods can help ease your discomfort. What guidelines do I need to follow?  Choose fruits, vegetables, whole grains, and low-fat dairy products.  Choose low-fat meat, fish,  and poultry.  Limit fats such as oils, salad dressings, butter, nuts, and avocado.  Keep a food diary. This helps you identify foods that cause symptoms.  Avoid foods that cause symptoms. These may be different for everyone.  Eat small meals often instead of 3 large meals a day.  Eat your meals slowly, in a place where you are relaxed.  Limit fried foods.  Cook foods using methods other than frying.  Avoid drinking alcohol.  Avoid drinking large amounts of liquids with your meals.  Avoid bending over or lying down until 2-3 hours after eating. What foods are not recommended? These are some foods and drinks that may make your symptoms worse: Vegetables  Tomatoes. Tomato juice. Tomato and spaghetti sauce. Chili peppers. Onion and garlic. Horseradish. Fruits  Oranges, grapefruit, and lemon (fruit and juice). Meats  High-fat meats, fish, and poultry. This includes hot dogs, ribs, ham, sausage, salami, and bacon. Dairy  Whole milk and chocolate milk. Sour cream. Cream. Butter. Ice cream. Cream cheese. Drinks  Coffee and tea. Bubbly (carbonated) drinks or energy drinks. Condiments  Hot sauce. Barbecue sauce. Sweets/Desserts  Chocolate and cocoa. Donuts. Peppermint and spearmint. Fats and Oils  High-fat foods. This includes French fries and potato chips. Other  Vinegar. Strong spices. This includes black pepper, white pepper, red pepper, cayenne, curry powder, cloves, ginger, and chili powder. The items listed above may not be a complete list of foods and drinks to avoid. Contact your dietitian for more information.  This information is not intended to replace advice given to you by your health care provider. Make sure you discuss any questions you have with your health care provider. Document Released: 01/29/2012 Document Revised: 01/05/2016 Document Reviewed: 06/03/2013 Elsevier Interactive Patient Education  2017 Elsevier Inc.  

## 2017-07-15 NOTE — Progress Notes (Signed)
Jessica Osborne , 12/04/1975, 41 y.o., female MRN: [id number] Patient Care Team    Relationship Specialty Notifications Start End  Natalia LeatherwoodKuneff, Masiah Lewing A, DO PCP - General Family Medicine  05/02/17   Leslye PeerByrum, Robert S, MD Consulting Physician Pulmonary Disease  06/30/13   Noland FordyceFogleman, Kelly, MD Consulting Physician Obstetrics and Gynecology  05/08/17     Chief Complaint  Patient presents with  . Gastroesophageal Reflux    x 2.5 weeks     Subjective: Pt presents for an OV with complaints of GERD-like sx of 2.5 weeks duration.  Associated symptoms include belching and heartburn. Worse in the morning.  Pt has tried zantac and nexium to ease their symptoms. Zantac was not helpful. Nexium 20 mg started 5 days helps a little. She denies nausea, vomit, fever, chills or diarrhea.    Anxiety: Pt reports her anxiety is increasing again. This occurs yearly around this time. She worked through last year and did not end up taking medication. She feels this year is worse and would like to try a medication. She has been on lexapro in the past, but only took for a few days.   [facility] 05/08/2017  Decreased Interest 0  Down, Depressed, Hopeless 0  PHQ - 2 Score 0    Allergies  Allergen Reactions  . Penicillins Rash   Social History   Tobacco Use  . Smoking status: Never Smoker  . Smokeless tobacco: Never Used  Substance Use Topics  . Alcohol use: Yes    Comment: Occ   Past Medical History:  Diagnosis Date  . Chicken pox   . Miscarriage   . Rib deformity    left side.   . Urinary tract bacterial infections    Past Surgical History:  Procedure Laterality Date  . TONSILLECTOMY    . VIDEO BRONCHOSCOPY Bilateral 04/16/2013   Procedure: VIDEO BRONCHOSCOPY WITH FLUORO;  Surgeon: Leslye Peerobert S Byrum, MD;  Location: [facility];  Service: Cardiopulmonary;  Laterality: Bilateral;   Family History  Problem Relation Age of Onset  . Asthma Brother   . Allergies Brother   . Rheum arthritis  Mother   . Arthritis Mother        rheumatoid  . Lupus Mother   . Diabetes Mother   . Stroke Maternal Grandfather   . Alcohol abuse Paternal Grandfather   . Heart disease Paternal Grandfather   . Heart disease Father   . Heart attack Father   . Heart disease Maternal Grandmother   . Alcohol abuse Paternal Grandmother   . Melanoma Maternal Aunt    Allergies as of 07/15/2017      Reactions   Penicillins Rash      Medication List        Accurate as of 07/15/17 11:03 AM. Always use your most recent med list.          albuterol 108 (90 Base) MCG/ACT inhaler Commonly known as:  PROVENTIL HFA;VENTOLIN HFA Inhale 2 puffs into the lungs every 6 (six) hours as needed for wheezing. Reported on 11/14/2015   cholecalciferol 1000 units tablet Commonly known as:  VITAMIN D Take 1,000 Units by mouth daily.   escitalopram 10 MG tablet Commonly known as:  LEXAPRO Take 1 tablet (10 mg total) by mouth daily.   hydrOXYzine 10 MG tablet Commonly known as:  ATARAX/VISTARIL Take 1-3 tablets (10-30 mg total) by mouth 3 times/day as needed-between meals & bedtime.   MULTIVITAMIN PO Take by mouth daily.   omeprazole  20 MG capsule Commonly known as:  PRILOSEC Take 2 capsules (40 mg total) by mouth daily for 30 days, THEN 1 capsule (20 mg total) daily. Start taking on:  07/15/2017       All past medical history, surgical history, allergies, family history, immunizations andmedications were updated in the EMR today and reviewed under the history and medication portions of their EMR.     ROS: Negative, with the exception of above mentioned in HPI   Objective:  BP 107/73 (BP Location: Right Arm, Patient Position: Sitting, Cuff Size: Normal)   Pulse 81   Temp 97.9 F (36.6 C)   Ht 5\' 5"  (1.651 m)   Wt 123 lb (55.8 kg)   LMP 07/03/2017   SpO2 99%   BMI 20.47 kg/m  Body mass index is 20.47 kg/m. Gen: Afebrile. No acute distress. Nontoxic in appearance, well developed, well nourished.    HENT: AT. [place].  MMM, no oral lesions.  Eyes:Pupils Equal Round Reactive to light, Extraocular movements intact,  Conjunctiva without redness, discharge or icterus. CV: RRR Chest: CTAB, no wheeze or crackles.  Abd: Soft. Flat. ND. Mild TTP epigastric region. BS present. no Masses palpated. Neuro:  Normal gait. PERLA. EOMi. Alert. Oriented x3 Psych: Normal affect, dress and demeanor. Normal speech. Normal thought content and judgment.  No exam data present No results found. No results found for this or any previous visit (from the past 24 hour(s)).  Assessment/Plan: Jessica Osborne is a 41 y.o. female present for OV for  Anxiety - discussed options with her today. She would like to try low dose lexapro again.  - Prescribed lexapro 10 mg QD - f/u 1 mo  Gastroesophageal reflux disease, esophagitis presence not specified - GERD discussed, discussed food triggers and medications.  - Omeprazole 40 mg Qd for 4 weeks then taper to 20 mg for 2 months. - Patient's last menstrual period was 07/03/2017.  - f/u 1-3 months depending on sx. AVS education provided.    Reviewed expectations re: course of current medical issues.  Discussed self-management of symptoms.  Outlined signs and symptoms indicating need for more acute intervention.  Patient verbalized understanding and all questions were answered.  Patient received an After-Visit Summary.    No orders of the defined types were placed in this encounter.    Note is dictated utilizing voice recognition software. Although note has been proof read prior to signing, occasional typographical errors still can be missed. If any questions arise, please do not hesitate to call for verification.   electronically signed by:  Felix Pacinienee Nevaeha Finerty, DO  [facility] - [place]

## 2017-07-26 ENCOUNTER — Ambulatory Visit: Payer: BLUE CROSS/BLUE SHIELD | Admitting: Family Medicine

## 2017-07-29 ENCOUNTER — Ambulatory Visit: Payer: BLUE CROSS/BLUE SHIELD | Admitting: Family Medicine

## 2017-07-29 ENCOUNTER — Encounter: Payer: Self-pay | Admitting: Family Medicine

## 2017-07-29 VITALS — BP 120/78 | HR 77 | Temp 97.9°F | Wt 122.0 lb

## 2017-07-29 DIAGNOSIS — G43809 Other migraine, not intractable, without status migrainosus: Secondary | ICD-10-CM

## 2017-07-29 DIAGNOSIS — K219 Gastro-esophageal reflux disease without esophagitis: Secondary | ICD-10-CM | POA: Diagnosis not present

## 2017-07-29 DIAGNOSIS — Z79899 Other long term (current) drug therapy: Secondary | ICD-10-CM

## 2017-07-29 LAB — H. PYLORI ANTIBODY, IGG: H Pylori IgG: NEGATIVE

## 2017-07-29 LAB — MAGNESIUM: MAGNESIUM: 2 mg/dL (ref 1.5–2.5)

## 2017-07-29 LAB — VITAMIN B12: VITAMIN B 12: 303 pg/mL (ref 211–911)

## 2017-07-29 MED ORDER — ESOMEPRAZOLE MAGNESIUM 20 MG PO CPDR: 20.0000 mg | DELAYED_RELEASE_CAPSULE | Freq: Every day | ORAL | 2 refills | Status: DC

## 2017-07-29 NOTE — Patient Instructions (Signed)
Start OTC B12 and magnesium supplement.  Continue nexium instead of prilosec since it caused you a headache.   We will call you with results and if positive, I will call in the treatment.      Heartburn Heartburn is a type of pain or discomfort that can happen in the throat or chest. It is often described as a burning pain. It may also cause a bad taste in the mouth. Heartburn may feel worse when you lie down or bend over. It may be caused by stomach contents that move back up (reflux) into the tube that connects the mouth with the stomach (esophagus). Follow these instructions at home: Take these actions to lessen your discomfort and to help avoid problems. Diet  Follow a diet as told by your doctor. You may need to avoid foods and drinks such as: ? Coffee and tea (with or without caffeine). ? Drinks that contain alcohol. ? Energy drinks and sports drinks. ? Carbonated drinks or sodas. ? Chocolate and cocoa. ? Peppermint and mint flavorings. ? Garlic and onions. ? Horseradish. ? Spicy and acidic foods, such as peppers, chili powder, curry powder, vinegar, hot sauces, and BBQ sauce. ? Citrus fruit juices and citrus fruits, such as oranges, lemons, and limes. ? Tomato-based foods, such as red sauce, chili, salsa, and pizza with red sauce. ? Fried and fatty foods, such as donuts, french fries, potato chips, and high-fat dressings. ? High-fat meats, such as hot dogs, rib eye steak, sausage, ham, and bacon. ? High-fat dairy items, such as whole milk, butter, and cream cheese.  Eat small meals often. Avoid eating large meals.  Avoid drinking large amounts of liquid with your meals.  Avoid eating meals during the 2-3 hours before bedtime.  Avoid lying down right after you eat.  Do not exercise right after you eat. General instructions  Pay attention to any changes in your symptoms.  Take over-the-counter and prescription medicines only as told by your doctor. Do not take aspirin,  ibuprofen, or other NSAIDs unless your doctor says it is okay.  Do not use any tobacco products, including cigarettes, chewing tobacco, and e-cigarettes. If you need help quitting, ask your doctor.  Wear loose clothes. Do not wear anything tight around your waist.  Raise (elevate) the head of your bed about 6 inches (15 cm).  Try to lower your stress. If you need help doing this, ask your doctor.  If you are overweight, lose an amount of weight that is healthy for you. Ask your doctor about a safe weight loss goal.  Keep all follow-up visits as told by your doctor. This is important. Contact a doctor if:  You have new symptoms.  You lose weight and you do not know why it is happening.  You have trouble swallowing, or it hurts to swallow.  You have wheezing or a cough that keeps happening.  Your symptoms do not get better with treatment.  You have heartburn often for more than two weeks. Get help right away if:  You have pain in your arms, neck, jaw, teeth, or back.  You feel sweaty, dizzy, or light-headed.  You have chest pain or shortness of breath.  You throw up (vomit) and your throw up looks like blood or coffee grounds.  Your poop (stool) is bloody or black. This information is not intended to replace advice given to you by your health care provider. Make sure you discuss any questions you have with your health care provider. Document  Released: 04/11/2011 Document Revised: 01/05/2016 Document Reviewed: 11/24/2014 Elsevier Interactive Patient Education  [employer]2018 Elsevier Inc.

## 2017-07-29 NOTE — Progress Notes (Signed)
Jessica Osborne , 09/04/1975, 41 y.o., female MRN: [id number] Patient Care Team    Relationship Specialty Notifications Start End  Natalia LeatherwoodKuneff, Renee A, DO PCP - General Family Medicine  05/02/17   Leslye PeerByrum, Robert S, MD Consulting Physician Pulmonary Disease  06/30/13   Noland FordyceFogleman, Kelly, MD Consulting Physician Obstetrics and Gynecology  05/08/17     Chief Complaint  Patient presents with  . Heartburn    pts complain of meds prescribe for her condition,describe history of migraine.     Subjective: Pt presents for an OV with complaints of Prilosec causes her to have migraines. She was started on Prilosec for heartburn and GERD-like symptoms approximately 2 weeks ago. She reports after taking for a few days she noticed a migraine start in the morning, and easily towards the afternoon. The next morning she took the Prilosec again, and again noticed a headache formation after taking Prilosec. She has been stopped taking Prilosec, and no headaches have occurred. She restarted the Nexium OTC, and has no headaches reported with use. Her heartburn/GERD-like symptoms has improved mildly with use of medication although has not resolved. She still endorses belching and full sensation in her epigastric region. Pt does not currently have a headache.    Dep[facility] screen [facility] 05/08/2017  Decreased Interest 0  Down, Depressed, Hopeless 0  PHQ - 2 Score 0    Allergies  Allergen Reactions  . Penicillins Rash   Social History   Tobacco Use  . Smoking status: Never Smoker  . Smokeless tobacco: Never Used  Substance Use Topics  . Alcohol use: Yes    Comment: Occ   Past Medical History:  Diagnosis Date  . Chicken pox   . Miscarriage   . Rib deformity    left side.   . Urinary tract bacterial infections    Past Surgical History:  Procedure Laterality Date  . TONSILLECTOMY    . VIDEO BRONCHOSCOPY Bilateral 04/16/2013   Procedure: VIDEO BRONCHOSCOPY WITH FLUORO;  Surgeon: Leslye Peerobert S Byrum, MD;   Location: [facility];  Service: Cardiopulmonary;  Laterality: Bilateral;   Family History  Problem Relation Age of Onset  . Asthma Brother   . Allergies Brother   . Rheum arthritis Mother   . Arthritis Mother        rheumatoid  . Lupus Mother   . Diabetes Mother   . Stroke Maternal Grandfather   . Alcohol abuse Paternal Grandfather   . Heart disease Paternal Grandfather   . Heart disease Father   . Heart attack Father   . Heart disease Maternal Grandmother   . Alcohol abuse Paternal Grandmother   . Melanoma Maternal Aunt    Allergies as of 07/29/2017      Reactions   Penicillins Rash      Medication List        Accurate as of 07/29/17  9:23 AM. Always use your most recent med list.          albuterol 108 (90 Base) MCG/ACT inhaler Commonly known as:  PROVENTIL HFA;VENTOLIN HFA Inhale 2 puffs into the lungs every 6 (six) hours as needed for wheezing. Reported on 11/14/2015   cholecalciferol 1000 units tablet Commonly known as:  VITAMIN D Take 1,000 Units by mouth daily.   escitalopram 10 MG tablet Commonly known as:  LEXAPRO Take 1 tablet (10 mg total) by mouth daily.   hydrOXYzine 10 MG tablet Commonly known as:  ATARAX/VISTARIL Take 1-3 tablets (10-30 mg total) by mouth 3 times/day  as needed-between meals & bedtime.   MULTIVITAMIN PO Take by mouth daily.   omeprazole 20 MG capsule Commonly known as:  PRILOSEC Take 2 capsules (40 mg total) by mouth daily for 30 days, THEN 1 capsule (20 mg total) daily. Start taking on:  07/15/2017       All past medical history, surgical history, allergies, family history, immunizations andmedications were updated in the EMR today and reviewed under the history and medication portions of their EMR.     ROS: Negative, with the exception of above mentioned in HPI   Objective:  BP 120/78 (BP Location: Left Arm, Patient Position: Sitting, Cuff Size: Normal)   Pulse 77   Temp 97.9 F (36.6 C) (Oral)   Wt 122 lb (55.3 kg)    LMP 07/28/2017   SpO2 98%   BMI 20.30 kg/m  Body mass index is 20.3 kg/m. Gen: Afebrile. No acute distress. Nontoxic in appearance, well developed, well nourished.  HENT: AT. [place]. MMM, no oral lesions.  Eyes:Pupils Equal Round Reactive to light, Extraocular movements intact,  Conjunctiva without redness, discharge or icterus. CV: RRR  Chest: CTAB, no wheeze or crackles.   Abd: Soft. Flat. Mild tenderness to palpation epigastric region only. Nondistended. Bowel sounds present. No masses. No rebound or guarding. Skin: no rashes, purpura or petechiae.   No exam data present No results found. No results found for this or any previous visit (from the past 24 hour(s)).  Assessment/Plan: Jessica CelesteBrooke N Lippold is a 41 y.o. female present for OV for  Gastroesophageal reflux disease, esophagitis presence not specified Current use of proton pump inhibitor Other migraine without status migrainosus, not intractable - esomeprazole (NEXIUM) 20 MG capsule; Take 1 capsule (20 mg total) by mouth daily at 12 noon.  Dispense: 30 capsule; Refill: 2 - H. pylori antibody, IgG--> no prior dx. Discussed IGG. If positive would treat with combination therapy.  - B12 - Magnesium - encouraged pt to try an OTC mag and B12 supplement. - F/U dependent upon lab result and clinic outcome. If worsening or not resolved, would refer to GI.   Reviewed expectations re: course of current medical issues.  Discussed self-management of symptoms.  Outlined signs and symptoms indicating need for more acute intervention.  Patient verbalized understanding and all questions were answered.  Patient received an After-Visit Summary.    No orders of the defined types were placed in this encounter.    Note is dictated utilizing voice recognition software. Although note has been proof read prior to signing, occasional typographical errors still can be missed. If any questions arise, please do not hesitate to call for  verification.   electronically signed by:  Felix Pacinienee Kuneff, DO  [facility] - [place]

## 2017-07-30 ENCOUNTER — Encounter: Payer: Self-pay | Admitting: Family Medicine

## 2017-07-30 DIAGNOSIS — E538 Deficiency of other specified B group vitamins: Secondary | ICD-10-CM | POA: Insufficient documentation

## 2017-08-15 ENCOUNTER — Ambulatory Visit: Payer: BLUE CROSS/BLUE SHIELD | Admitting: Family Medicine

## 2017-09-06 ENCOUNTER — Ambulatory Visit: Payer: BLUE CROSS/BLUE SHIELD | Admitting: Family Medicine

## 2017-09-06 ENCOUNTER — Encounter: Payer: Self-pay | Admitting: Family Medicine

## 2017-09-06 DIAGNOSIS — K219 Gastro-esophageal reflux disease without esophagitis: Secondary | ICD-10-CM | POA: Diagnosis not present

## 2017-09-06 MED ORDER — OSELTAMIVIR PHOSPHATE 75 MG PO CAPS: 75.0000 mg | ORAL_CAPSULE | Freq: Two times a day (BID) | ORAL | 0 refills | Status: DC

## 2017-09-06 MED ORDER — ESOMEPRAZOLE MAGNESIUM 40 MG PO CPDR: 40.0000 mg | DELAYED_RELEASE_CAPSULE | Freq: Every day | ORAL | 2 refills | Status: DC

## 2017-09-06 NOTE — Patient Instructions (Addendum)
Increase Nexium to 40 mg a day for at least 4 weeks for a good trial.  Start daily antihistamine for allergies (over the counter).  If not improved in 4 weeks would want to refer to gastro.  Otherwise follow up in 3 months.     Food Choices for Gastroesophageal Reflux Disease, Adult When you have gastroesophageal reflux disease (GERD), the foods you eat and your eating habits are very important. Choosing the right foods can help ease your discomfort. What guidelines do I need to follow?  Choose fruits, vegetables, whole grains, and low-fat dairy products.  Choose low-fat meat, fish, and poultry.  Limit fats such as oils, salad dressings, butter, nuts, and avocado.  Keep a food diary. This helps you identify foods that cause symptoms.  Avoid foods that cause symptoms. These may be different for everyone.  Eat small meals often instead of 3 large meals a day.  Eat your meals slowly, in a place where you are relaxed.  Limit fried foods.  Cook foods using methods other than frying.  Avoid drinking alcohol.  Avoid drinking large amounts of liquids with your meals.  Avoid bending over or lying down until 2-3 hours after eating. What foods are not recommended? These are some foods and drinks that may make your symptoms worse: Vegetables Tomatoes. Tomato juice. Tomato and spaghetti sauce. Chili peppers. Onion and garlic. Horseradish. Fruits Oranges, grapefruit, and lemon (fruit and juice). Meats High-fat meats, fish, and poultry. This includes hot dogs, ribs, ham, sausage, salami, and bacon. Dairy Whole milk and chocolate milk. Sour cream. Cream. Butter. Ice cream. Cream cheese. Drinks Coffee and tea. Bubbly (carbonated) drinks or energy drinks. Condiments Hot sauce. Barbecue sauce. Sweets/Desserts Chocolate and cocoa. Donuts. Peppermint and spearmint. Fats and Oils High-fat foods. This includes [place] fries and potato chips. Other Vinegar. Strong spices. This includes  black pepper, white pepper, red pepper, cayenne, curry powder, cloves, ginger, and chili powder. The items listed above may not be a complete list of foods and drinks to avoid. Contact your dietitian for more information. This information is not intended to replace advice given to you by your health care provider. Make sure you discuss any questions you have with your health care provider. Document Released: 01/29/2012 Document Revised: 01/05/2016 Document Reviewed: 06/03/2013 Elsevier Interactive Patient Education  2017 [employer]

## 2017-09-06 NOTE — Progress Notes (Signed)
Jessica Osborne , October 03, 1975, 42 y.o., female MRN: [id number] Patient Care Team    Relationship Specialty Notifications Start End  Natalia Leatherwood, DO PCP - General Family Medicine  05/02/17   Leslye Peer, MD Consulting Physician Pulmonary Disease  06/30/13   Noland Fordyce, MD Consulting Physician Obstetrics and Gynecology  05/08/17     Chief Complaint  Patient presents with  . URI    drainage, cough x 1 month     Subjective: Pt presents for an OV with complaints of cough of 1 month duration.  Associated symptoms include symptoms are worse at night and first thing in the morning. She endorses some mild drainage. He states since the end of November her husband had a cold in which she caught. She ended up feeling better after a week or 2, but the drainage remained. He has been seen here for reflux like symptoms and started on Prilosec initially, which induced a migraine, therefore therapy was changed to Nexium. She states she took the Nexium for a couple weeks and then stopped. She started trying homeopathic options such as drinking aloe and taking probiotics which does not seem to have helped her. She does endorse a metallic taste in the back of her mouth/throat. She also endorses a fullness feeling in her throat. She is having no difficulty breathing or swallowing.   Depression screen [facility] 2/9 09/06/2017 05/08/2017  Decreased Interest 0 0  Down, Depressed, Hopeless 0 0  PHQ - 2 Score 0 0    Allergies  Allergen Reactions  . Penicillins Rash   Social History   Tobacco Use  . Smoking status: Never Smoker  . Smokeless tobacco: Never Used  Substance Use Topics  . Alcohol use: Yes    Comment: Occ   Past Medical History:  Diagnosis Date  . Chicken pox   . Miscarriage   . Rib deformity    left side.   . Urinary tract bacterial infections    Past Surgical History:  Procedure Laterality Date  . TONSILLECTOMY    . VIDEO BRONCHOSCOPY Bilateral 04/16/2013   Procedure: VIDEO  BRONCHOSCOPY WITH FLUORO;  Surgeon: Leslye Peer, MD;  Location: [facility];  Service: Cardiopulmonary;  Laterality: Bilateral;   Family History  Problem Relation Age of Onset  . Asthma Brother   . Allergies Brother   . Rheum arthritis Mother   . Arthritis Mother        rheumatoid  . Lupus Mother   . Diabetes Mother   . Stroke Maternal Grandfather   . Alcohol abuse Paternal Grandfather   . Heart disease Paternal Grandfather   . Heart disease Father   . Heart attack Father   . Heart disease Maternal Grandmother   . Alcohol abuse Paternal Grandmother   . Melanoma Maternal Aunt    Allergies as of 09/06/2017      Reactions   Penicillins Rash      Medication List        Accurate as of 09/06/17 11:00 AM. Always use your most recent med list.          albuterol 108 (90 Base) MCG/ACT inhaler Commonly known as:  PROVENTIL HFA;VENTOLIN HFA Inhale 2 puffs into the lungs every 6 (six) hours as needed for wheezing. Reported on 11/14/2015   B-COMPLEX/B-12 SL Place under the tongue.   cholecalciferol 1000 units tablet Commonly known as:  VITAMIN D Take 1,000 Units by mouth daily.   esomeprazole 20 MG capsule Commonly known as:  NEXIUM Take 1 capsule (20 mg total) by mouth daily at 12 noon.   hydrOXYzine 10 MG tablet Commonly known as:  ATARAX/VISTARIL Take 1-3 tablets (10-30 mg total) by mouth 3 times/day as needed-between meals & bedtime.   MULTIVITAMIN PO Take by mouth daily.       All past medical history, surgical history, allergies, family history, immunizations andmedications were updated in the EMR today and reviewed under the history and medication portions of their EMR.     ROS: Negative, with the exception of above mentioned in HPI   Objective:  BP 111/77 (BP Location: Left Arm, Patient Position: Sitting, Cuff Size: Normal)   Pulse 81   Temp 98.3 F (36.8 C)   Resp 20   Wt 123 lb 12 oz (56.1 kg)   LMP 08/19/2017   SpO2 98%   BMI 20.59 kg/m  Body  mass index is 20.59 kg/m. Gen: Afebrile. No acute distress. Nontoxic in appearance, well developed, well nourished.  HENT: AT. [place]. Bilateral TM visualized without erythema or bulging. MMM, no oral lesions. Bilateral nares without erythema or drainage. Throat without erythema or exudates. No postnasal drip. No cough. No hoarseness. Eyes:Pupils Equal Round Reactive to light, Extraocular movements intact,  Conjunctiva without redness, discharge or icterus. Neck/lymp/endocrine: Supple, no lymphadenopathy CV: RRR  Chest: CTAB, no wheeze or crackles. Good air movement, normal resp effort.   No exam data present No results found. No results found for this or any previous visit (from the past 24 hour(s)).  Assessment/Plan: Mardene CelesteBrooke N Garlick is a 42 y.o. female present for OV for  Gastroesophageal reflux disease, esophagitis presence not specified Cough: - patient does not have infectious signs and symptoms today. Believe her chronic cough is secondary to her reflux. - Patient counseled on watching food triggers, not laying flat after meals, and other GERD education was provided to her by a AVS. - Patient agreed to try Nexium again. Encouraged her to take continuously for 4 weeks. If her symptoms are not improved at that time she can call and I would like to refer her to a gastroenterologist for further studies. - She is to also start a over-the-counter allergy medicine to rule out that as potential cause. - esomeprazole (NEXIUM) 40 MG capsule; Take 1 capsule (40 mg total) by mouth daily at 12 noon.  Dispense: 30 capsule; Refill: 2 - if doing well follow-up in 3 months so that we can discuss tapering or continuation of medicine.   Reviewed expectations re: course of current medical issues.  Discussed self-management of symptoms.  Outlined signs and symptoms indicating need for more acute intervention.  Patient verbalized understanding and all questions were answered.  Patient received an  After-Visit Summary.    No orders of the defined types were placed in this encounter.    Note is dictated utilizing voice recognition software. Although note has been proof read prior to signing, occasional typographical errors still can be missed. If any questions arise, please do not hesitate to call for verification.   electronically signed by:  Felix Pacinienee Kuneff, DO  [facility] - [place]

## 2017-09-20 DIAGNOSIS — R030 Elevated blood-pressure reading, without diagnosis of hypertension: Secondary | ICD-10-CM | POA: Diagnosis not present

## 2017-09-20 DIAGNOSIS — J0100 Acute maxillary sinusitis, unspecified: Secondary | ICD-10-CM | POA: Diagnosis not present

## 2017-09-25 ENCOUNTER — Other Ambulatory Visit (INDEPENDENT_AMBULATORY_CARE_PROVIDER_SITE_OTHER): Payer: BLUE CROSS/BLUE SHIELD

## 2017-09-25 DIAGNOSIS — Z13220 Encounter for screening for lipoid disorders: Secondary | ICD-10-CM | POA: Diagnosis not present

## 2017-09-25 DIAGNOSIS — Z131 Encounter for screening for diabetes mellitus: Secondary | ICD-10-CM

## 2017-09-25 DIAGNOSIS — R5383 Other fatigue: Secondary | ICD-10-CM | POA: Diagnosis not present

## 2017-09-25 DIAGNOSIS — Z8249 Family history of ischemic heart disease and other diseases of the circulatory system: Secondary | ICD-10-CM | POA: Diagnosis not present

## 2017-09-25 DIAGNOSIS — F419 Anxiety disorder, unspecified: Secondary | ICD-10-CM | POA: Diagnosis not present

## 2017-09-25 LAB — LIPID PANEL
CHOLESTEROL: 207 mg/dL — AB (ref 0–200)
HDL: 72.3 mg/dL (ref >39.00)
LDL Cholesterol: 118 mg/dL — ABNORMAL HIGH (ref 0–99)
NonHDL: 134.79
TRIGLYCERIDES: 86 mg/dL (ref 0.0–149.0)
Total CHOL/HDL Ratio: 3
VLDL: 17.2 mg/dL (ref 0.0–40.0)

## 2017-09-25 LAB — COMPREHENSIVE METABOLIC PANEL
ALBUMIN: 4.4 g/dL (ref 3.5–5.2)
ALK PHOS: 47 U/L (ref 39–117)
ALT: 11 U/L (ref 0–35)
AST: 10 U/L (ref 0–37)
BUN: 8 mg/dL (ref 6–23)
CO2: 31 mEq/L (ref 19–32)
CREATININE: 0.63 mg/dL (ref 0.40–1.20)
Calcium: 9.1 mg/dL (ref 8.4–10.5)
Chloride: 102 mEq/L (ref 96–112)
GFR: 109.22 mL/min (ref >60.00)
GLUCOSE: 87 mg/dL (ref 70–99)
Potassium: 4.2 mEq/L (ref 3.5–5.1)
SODIUM: 137 meq/L (ref 135–145)
TOTAL PROTEIN: 7 g/dL (ref 6.0–8.3)
Total Bilirubin: 0.9 mg/dL (ref 0.2–1.2)

## 2017-09-25 LAB — CBC
HCT: 42.1 % (ref 36.0–46.0)
HEMOGLOBIN: 14.5 g/dL (ref 12.0–15.0)
MCHC: 34.4 g/dL (ref 30.0–36.0)
MCV: 93.8 fl (ref 78.0–100.0)
Platelets: 231 10*3/uL (ref 150.0–400.0)
RBC: 4.49 Mil/uL (ref 3.87–5.11)
RDW: 12.9 % (ref 11.5–15.5)
WBC: 5.1 10*3/uL (ref 4.0–10.5)

## 2017-09-25 LAB — HEMOGLOBIN A1C: Hgb A1c MFr Bld: 5.2 % (ref 4.6–6.5)

## 2017-09-25 LAB — TSH: TSH: 1.1 u[IU]/mL (ref 0.35–4.50)

## 2017-09-25 LAB — VITAMIN D 25 HYDROXY (VIT D DEFICIENCY, FRACTURES): VITD: 26.2 ng/mL — ABNORMAL LOW (ref 30.00–100.00)

## 2017-10-02 DIAGNOSIS — R079 Chest pain, unspecified: Secondary | ICD-10-CM | POA: Diagnosis not present

## 2017-10-02 DIAGNOSIS — R1084 Generalized abdominal pain: Secondary | ICD-10-CM | POA: Diagnosis not present

## 2017-10-02 DIAGNOSIS — R1013 Epigastric pain: Secondary | ICD-10-CM | POA: Diagnosis not present

## 2017-10-02 LAB — HEPATIC FUNCTION PANEL
ALT: 16 (ref 7–35)
AST: 13 (ref 13–35)
Alkaline Phosphatase: 62 (ref 25–125)
Bilirubin, Total: 0.6

## 2017-10-02 LAB — BASIC METABOLIC PANEL
BUN: 7 (ref 4–21)
Creatinine: 0.7 (ref 0.5–1.1)
Glucose: 94
POTASSIUM: 4.4 (ref 3.4–5.3)
Sodium: 140 (ref 137–147)

## 2017-10-03 ENCOUNTER — Telehealth: Payer: Self-pay | Admitting: Family Medicine

## 2017-10-03 NOTE — Telephone Encounter (Signed)
Spoke with patient scheduled her for an appt to see Dr Claiborne BillingsKuneff she states she [facility] and had elevated calcium .

## 2017-10-03 NOTE — Telephone Encounter (Signed)
Copied from CRM [id number]. Topic: Quick Communication - See Telephone Encounter >> Oct 03, 2017  8:25 AM Oneal GroutSebastian, Jennifer S wrote: CRM for notification. See Telephone encounter for:  Patient would like to speak with nurse regarding a referral to Endo dr for thyroid. Would like Dr Claiborne BillingsKuneff input. Please advise 10/03/17.

## 2017-10-07 ENCOUNTER — Encounter: Payer: Self-pay | Admitting: Physician Assistant

## 2017-10-07 ENCOUNTER — Telehealth: Payer: Self-pay | Admitting: Family Medicine

## 2017-10-07 DIAGNOSIS — M791 Myalgia, unspecified site: Secondary | ICD-10-CM | POA: Diagnosis not present

## 2017-10-07 DIAGNOSIS — R51 Headache: Secondary | ICD-10-CM | POA: Diagnosis not present

## 2017-10-07 NOTE — Telephone Encounter (Signed)
Called patient to reschedule 10/08/17 appointment. Patient asked if she could just get a lab visit to test her thyroid.

## 2017-10-08 ENCOUNTER — Encounter: Payer: Self-pay | Admitting: Family Medicine

## 2017-10-08 ENCOUNTER — Ambulatory Visit: Payer: BLUE CROSS/BLUE SHIELD | Admitting: Family Medicine

## 2017-10-09 NOTE — Telephone Encounter (Signed)
Left message for patient she had TSH drawn on 09/25/17 which was normal previous appt was to discusss patients request for referral to endocrinology.

## 2017-10-16 DIAGNOSIS — K219 Gastro-esophageal reflux disease without esophagitis: Secondary | ICD-10-CM | POA: Diagnosis not present

## 2017-10-16 DIAGNOSIS — E559 Vitamin D deficiency, unspecified: Secondary | ICD-10-CM | POA: Diagnosis not present

## 2017-10-16 DIAGNOSIS — F419 Anxiety disorder, unspecified: Secondary | ICD-10-CM | POA: Diagnosis not present

## 2017-10-16 DIAGNOSIS — N943 Premenstrual tension syndrome: Secondary | ICD-10-CM | POA: Diagnosis not present

## 2017-10-16 DIAGNOSIS — R5383 Other fatigue: Secondary | ICD-10-CM | POA: Diagnosis not present

## 2017-10-16 DIAGNOSIS — R7989 Other specified abnormal findings of blood chemistry: Secondary | ICD-10-CM | POA: Diagnosis not present

## 2017-10-16 DIAGNOSIS — G4700 Insomnia, unspecified: Secondary | ICD-10-CM | POA: Diagnosis not present

## 2017-10-16 DIAGNOSIS — M2550 Pain in unspecified joint: Secondary | ICD-10-CM | POA: Diagnosis not present

## 2017-10-24 ENCOUNTER — Encounter: Payer: Self-pay | Admitting: Physician Assistant

## 2017-10-24 ENCOUNTER — Ambulatory Visit: Payer: BLUE CROSS/BLUE SHIELD | Admitting: Physician Assistant

## 2017-10-24 VITALS — BP 120/88 | HR 74 | Ht 66.5 in | Wt 119.0 lb

## 2017-10-24 DIAGNOSIS — R198 Other specified symptoms and signs involving the digestive system and abdomen: Secondary | ICD-10-CM

## 2017-10-24 DIAGNOSIS — K5900 Constipation, unspecified: Secondary | ICD-10-CM

## 2017-10-24 DIAGNOSIS — R0989 Other specified symptoms and signs involving the circulatory and respiratory systems: Secondary | ICD-10-CM

## 2017-10-24 DIAGNOSIS — F458 Other somatoform disorders: Secondary | ICD-10-CM | POA: Diagnosis not present

## 2017-10-24 DIAGNOSIS — K219 Gastro-esophageal reflux disease without esophagitis: Secondary | ICD-10-CM

## 2017-10-24 MED ORDER — PANTOPRAZOLE SODIUM 40 MG PO TBEC: 40.0000 mg | DELAYED_RELEASE_TABLET | Freq: Every day | ORAL | 1 refills | Status: AC

## 2017-10-24 MED ORDER — SUCRALFATE 1 G PO TABS: 1.0000 g | ORAL_TABLET | Freq: Three times a day (TID) | ORAL | 1 refills | Status: AC

## 2017-10-24 NOTE — Progress Notes (Addendum)
Chief Complaint: GERD, constipation, globus sensation  HPI:    Jessica Osborne is a 42 year old Caucasian female with a past medical history as listed below, who was referred to me by Felix Pacini A, DO for a complaint of GERD, constipation and globus sensation.      Today, patient describes that for the past 3-4 months she has been experiencing reflux symptoms described as the taste of acid in her mouth as well as an increase in burping.  Initially started on Omeprazole 20 mg daily which resulted in daily headaches and switched to Esomeprazole 20 mg which helped some, this was increased to 40 mg recently and helps with the reflux part but "not with the feeling full or the sensation of something in my throat".  Patient did try herbal remedies including DGL licorice and aloe which were of no help.  Continues to complain of a "chest tightness" at times.  Does admit to a lot of sugar and carbs and alcohol as well as increased stress over the holidays when the symptoms started, but currently has changed her diet and seen no benefit.  Wedge pillow has been of some benefit.    Also with constipation over the past few months and passing daily hard stools which are very small and sometimes contain undigested food.  Has been trying over-the-counter herbal remedies over the past couple of days.     Denies fever, chills, melena, blood in her stool, weight loss, nausea, vomiting or symptoms that awaken her at night.  Past Medical History:  Diagnosis Date  . Chicken pox   . Miscarriage   . Rib deformity    left side.   . Urinary tract bacterial infections     Past Surgical History:  Procedure Laterality Date  . TONSILLECTOMY    . VIDEO BRONCHOSCOPY Bilateral 04/16/2013   Procedure: VIDEO BRONCHOSCOPY WITH FLUORO;  Surgeon: Leslye Peer, MD;  Location: [facility];  Service: Cardiopulmonary;  Laterality: Bilateral;    Current Outpatient Medications  Medication Sig Dispense Refill  . B Complex Vitamins  (B-COMPLEX/B-12 SL) Place under the tongue.    . cholecalciferol (VITAMIN D) 1000 UNITS tablet Take 1,000 Units by mouth daily.    Marland Kitchen esomeprazole (NEXIUM) 40 MG capsule Take 1 capsule (40 mg total) by mouth daily at 12 noon. (Patient taking differently: Take 20 mg by mouth daily at 12 noon. ) 30 capsule 2  . Multiple Vitamins-Minerals (MULTIVITAMIN PO) Take by mouth daily.     No current facility-administered medications for this visit.     Allergies as of 10/24/2017 - Review Complete 10/24/2017  Allergen Reaction Noted  . Penicillins Rash 03/11/2013    Family History  Problem Relation Age of Onset  . Asthma Brother   . Allergies Brother   . Rheum arthritis Mother   . Arthritis Mother        rheumatoid  . Lupus Mother   . Diabetes Mother   . Stroke Maternal Grandfather   . Alcohol abuse Paternal Grandfather   . Heart disease Paternal Grandfather   . Heart disease Father   . Heart attack Father   . Heart disease Maternal Grandmother   . Alcohol abuse Paternal Grandmother   . Melanoma Maternal Aunt     Social History   Socioeconomic History  . Marital status: Married    Spouse name: Not on file  . Number of children: 2  . Years of education: bachelors  . Highest education level: Not on file  Social  Needs  . Financial resource strain: Not on file  . Food insecurity - worry: Not on file  . Food insecurity - inability: Not on file  . Transportation needs - medical: Not on file  . Transportation needs - non-medical: Not on file  Occupational History  . Occupation: stay at home mother    Employer: NOT EMPLOYED  Tobacco Use  . Smoking status: Never Smoker  . Smokeless tobacco: Never Used  Substance and Sexual Activity  . Alcohol use: Yes    Comment: Occ  . Drug use: No  . Sexual activity: Yes    Birth control/protection: IUD  Other Topics Concern  . Not on file  Social History Narrative   Ms. Haughton is married, lives with her husband & 2 young children. She works  in the home.     Review of Systems:    Constitutional: No weight loss, fever or chills Skin: No rash  Cardiovascular: No chest pain Respiratory: No SOB Gastrointestinal: See HPI and otherwise negative Genitourinary: No dysuria Neurological: No headache, dizziness or syncope Musculoskeletal: No new muscle or joint pain Hematologic: No bleeding  Psychiatric: No history of depression or anxiety   Physical Exam:  Vital signs: BP 120/88   Pulse 74   Ht 5' 6.5" (1.689 m)   Wt 119 lb (54 kg)   BMI 18.92 kg/m   Constitutional:   Pleasant Caucasian female appears to be in NAD, Well developed, Well nourished, alert and cooperative Head:  Normocephalic and atraumatic. Eyes:   PEERL, EOMI. No icterus. Conjunctiva pink. Ears:  Normal auditory acuity. Neck:  Supple Throat: Oral cavity and pharynx without inflammation, swelling or lesion.  Respiratory: Respirations even and unlabored. Lungs clear to auscultation bilaterally.   No wheezes, crackles, or rhonchi.  Cardiovascular: Normal S1, S2. No MRG. Regular rate and rhythm. No peripheral edema, cyanosis or pallor.  Gastrointestinal:  Soft, nondistended,mild epigastric ttp, No rebound or guarding. Normal bowel sounds. No appreciable masses or hepatomegaly. Rectal:  Not performed.  Msk:  Symmetrical without gross deformities. Without edema, no deformity or joint abnormality.  Neurologic:  Alert and  oriented x4;  grossly normal neurologically.  Skin:   Dry and intact without significant lesions or rashes. Psychiatric: Demonstrates good judgement and reason without abnormal affect or behaviors.  MOST RECENT LABS AND IMAGING: CBC    Component Value Date/Time   WBC 5.1 09/25/2017 0847   RBC 4.49 09/25/2017 0847   HGB 14.5 09/25/2017 0847   HGB 14.6 07/20/2008   HCT 42.1 09/25/2017 0847   HCT 41 05/12/2013   PLT 231.0 09/25/2017 0847   PLT 183 02/11/2009 1431   PLT 179 10/03/2007   MCV 93.8 09/25/2017 0847   MCH 32.3 05/07/2013    MCHC 34.4 09/25/2017 0847   RDW 12.9 09/25/2017 0847   RDW 34.6 05/12/2013   LYMPHSABS 78 05/12/2013   EOSABS 0 05/12/2013   EOSABS 0 05/12/2013   BASOSABS 0 05/07/2013   BASOSABS 0 05/07/2013    CMP     Component Value Date/Time   NA 140 10/02/2017   K 4.4 10/02/2017   K 4.2 05/07/2013   CL 102 09/25/2017 0847   CL 103 05/07/2013   CO2 31 09/25/2017 0847   CO2 21 05/07/2013   GLUCOSE 87 09/25/2017 0847   BUN 7 10/02/2017   BUN 7 05/07/2013   CREATININE 0.7 10/02/2017   CREATININE 0.63 09/25/2017 0847   CREATININE 0.63 11/27/2011   CALCIUM 9.1 09/25/2017 0847   CALCIUM 9.7  05/07/2013   PROT 7.0 09/25/2017 0847   PROT 7.3 05/07/2013   ALBUMIN 4.4 09/25/2017 0847   ALBUMIN 4.5 05/07/2013   AST 13 10/02/2017   AST 14 05/07/2013   ALT 16 10/02/2017   ALKPHOS 62 10/02/2017   ALKPHOS 57 02/25/2013   BILITOT 0.9 09/25/2017 0847   BILITOT 1.0 05/07/2013   GFRNONAA 119 05/07/2013   GFRAA 137 05/07/2013    Assessment: 1.  GERD: Over the past 3-4 months, unchanged on daily PPI or diet modification, initially increased alcohol and fatty foods as well as increased stress, no further over the past few months and continued symptoms, H. pylori antibodies negative per PCP; consider H. pylori versus gastritis versus functional dyspepsia 2.  Constipation: Over the past 3-4 months, no change with herbal remedies over the past couple of days 3.  Globus sensation: Past 3-4 months with reflux as above; consider esophagitis related to GERD vs stricture  Plan: 1.  Scheduled patient for [facility]  Did discuss risk, benefits, limitations and alternatives and the patient agrees to proceed. 2.  Recommend patient discontinue her Nexium and start Pantoprazole 40 mg daily, 30-60 minutes before breakfast #30 with 1 refill 3.  Reviewed antireflux diet and lifestyle modifications. 4.  Prescribed Carafate 1 g 3 times daily, 20-30 minutes before meals #90 with 1 refill 5.   Continue increased fiber, water and exercise. 6.  Add MiraLAX once daily for constipation.  Did discuss she can titrate this up to 4 times daily as needed. 7.  Tried to reassure the patient that medications above will help her between now and time of EGD.  She is frustrated as she is trying to change everything in her life and her symptoms have not changed at all. 8.  Patient to follow in clinic with Dr. Rhea BeltonPyrtle per recommendations after time of procedure.  Hyacinth MeekerJennifer Lakeva Hollon, PA-C [facility] 10/24/2017, 9:49 AM  Cc: Natalia LeatherwoodKuneff, Renee A, DO   Addendum: Reviewed and agree with initial management. Pyrtle, Carie CaddyJay M, MD

## 2017-10-24 NOTE — Patient Instructions (Addendum)
Stop Nexium (esomprazole)   We have sent the following medications to your pharmacy for you to pick up at your convenience: Pantoprazole 40 mg daily 30-60 minutes before breakfast   Carafate 1 gram three times a day before meals.   Start Miralax once daily.   You have been scheduled for an endoscopy. Please follow written instructions given to you at your visit today. If you use inhalers (even only as needed), please bring them with you on the day of your procedure. Your physician has requested that you go to www.startemmi.com and enter the access code given to you at your visit today. This web site gives a general overview about your procedure. However, you should still follow specific instructions given to you by our office regarding your preparation for the procedure.

## 2017-10-28 ENCOUNTER — Ambulatory Visit: Payer: BLUE CROSS/BLUE SHIELD | Admitting: Family Medicine

## 2017-10-28 ENCOUNTER — Encounter: Payer: Self-pay | Admitting: Family Medicine

## 2017-10-28 VITALS — BP 121/84 | HR 100 | Temp 98.0°F | Resp 20 | Ht 66.5 in | Wt 116.2 lb

## 2017-10-28 DIAGNOSIS — M545 Low back pain, unspecified: Secondary | ICD-10-CM

## 2017-10-28 DIAGNOSIS — R140 Abdominal distension (gaseous): Secondary | ICD-10-CM | POA: Diagnosis not present

## 2017-10-28 DIAGNOSIS — K219 Gastro-esophageal reflux disease without esophagitis: Secondary | ICD-10-CM | POA: Diagnosis not present

## 2017-10-28 DIAGNOSIS — R1013 Epigastric pain: Secondary | ICD-10-CM

## 2017-10-28 DIAGNOSIS — R109 Unspecified abdominal pain: Secondary | ICD-10-CM | POA: Diagnosis not present

## 2017-10-28 DIAGNOSIS — G8929 Other chronic pain: Secondary | ICD-10-CM

## 2017-10-28 LAB — POC URINALSYSI DIPSTICK (AUTOMATED)
Bilirubin, UA: NEGATIVE
GLUCOSE UA: NEGATIVE
Leukocytes, UA: NEGATIVE
NITRITE UA: NEGATIVE
PH UA: 6 (ref 5.0–8.0)
Protein, UA: NEGATIVE
RBC UA: NEGATIVE
Spec Grav, UA: 1.02 (ref 1.010–1.025)
UROBILINOGEN UA: 0.2 U/dL

## 2017-10-28 LAB — COMPREHENSIVE METABOLIC PANEL
ALBUMIN: 5.2 g/dL (ref 3.5–5.2)
ALK PHOS: 48 U/L (ref 39–117)
ALT: 10 U/L (ref 0–35)
AST: 10 U/L (ref 0–37)
BILIRUBIN TOTAL: 1.1 mg/dL (ref 0.2–1.2)
BUN: 6 mg/dL (ref 6–23)
CALCIUM: 10.4 mg/dL (ref 8.4–10.5)
CO2: 25 meq/L (ref 19–32)
CREATININE: 0.62 mg/dL (ref 0.40–1.20)
Chloride: 99 mEq/L (ref 96–112)
GFR: 111.21 mL/min (ref >60.00)
Glucose, Bld: 91 mg/dL (ref 70–99)
Potassium: 3.9 mEq/L (ref 3.5–5.1)
Sodium: 137 mEq/L (ref 135–145)
TOTAL PROTEIN: 8 g/dL (ref 6.0–8.3)

## 2017-10-28 LAB — POCT URINE PREGNANCY: PREG TEST UR: NEGATIVE

## 2017-10-28 MED ORDER — TRAMADOL HCL 50 MG PO TABS: 50.0000 mg | ORAL_TABLET | Freq: Two times a day (BID) | ORAL | 0 refills | Status: AC | PRN

## 2017-10-28 NOTE — Progress Notes (Signed)
Jessica Osborne , 30-Aug-1975, 42 y.o., female MRN: [id number] Patient Care Team    Relationship Specialty Notifications Start End  Ma Hillock, DO PCP - General Family Medicine  05/02/17   Collene Gobble, MD Consulting Physician Pulmonary Disease  06/30/13   Aloha Gell, MD Consulting Physician Obstetrics and Gynecology  05/08/17     Chief Complaint  Patient presents with  . Abdominal Pain     Subjective:  Pt reports her abdominal is worsening. It is daily and multiple times a day. She feels the pain is more intense. She feels more bloated, belching, constipated. She has new left lower back pain that she is concerned has to do with her abd pain or colon. She has an EGD scheduled in 1 week with Dr. Raquel James. She is tolerating PO but does not have an appetite. She started the carafate and protonix end of last week. She stopped miralax use. She is tearful and worried something more serious is going on than gastris or GERD now that she is having symptoms in back.   Prior note:  Pt presents for an OV with complaints of cough of 1 month duration.  Associated symptoms include symptoms are worse at night and first thing in the morning. She endorses some mild drainage. He states since the end of November her husband had a cold in which she caught. She ended up feeling better after a week or 2, but the drainage remained. He has been seen here for reflux like symptoms and started on Prilosec initially, which induced a migraine, therefore therapy was changed to Nexium. She states she took the Nexium for a couple weeks and then stopped. She started trying homeopathic options such as drinking aloe and taking probiotics which does not seem to have helped her. She does endorse a metallic taste in the back of her mouth/throat. She also endorses a fullness feeling in her throat. She is having no difficulty breathing or swallowing.   Depression screen [facility] 2/9 09/06/2017 05/08/2017  Decreased Interest 0 0    Down, Depressed, Hopeless 0 0  PHQ - 2 Score 0 0    Allergies  Allergen Reactions  . Penicillins Rash   Social History   Tobacco Use  . Smoking status: Never Smoker  . Smokeless tobacco: Never Used  Substance Use Topics  . Alcohol use: Yes    Comment: Occ   Past Medical History:  Diagnosis Date  . Chicken pox   . Miscarriage   . Rib deformity    left side.   . Urinary tract bacterial infections    Past Surgical History:  Procedure Laterality Date  . TONSILLECTOMY    . VIDEO BRONCHOSCOPY Bilateral 04/16/2013   Procedure: VIDEO BRONCHOSCOPY WITH FLUORO;  Surgeon: Collene Gobble, MD;  Location: [facility];  Service: Cardiopulmonary;  Laterality: Bilateral;   Family History  Problem Relation Age of Onset  . Asthma Brother   . Allergies Brother   . Rheum arthritis Mother   . Arthritis Mother        rheumatoid  . Lupus Mother   . Diabetes Mother   . Stroke Maternal Grandfather   . Alcohol abuse Paternal Grandfather   . Heart disease Paternal Grandfather   . Heart disease Father   . Heart attack Father   . Heart disease Maternal Grandmother   . Alcohol abuse Paternal Grandmother   . Melanoma Maternal Aunt    Allergies as of 10/28/2017      Reactions  Penicillins Rash      Medication List        Accurate as of 10/28/17  2:50 PM. Always use your most recent med list.          B-COMPLEX/B-12 SL Place under the tongue.   cholecalciferol 1000 units tablet Commonly known as:  VITAMIN D Take 1,000 Units by mouth daily.   MULTIVITAMIN PO Take by mouth daily.   pantoprazole 40 MG tablet Commonly known as:  PROTONIX Take 1 tablet (40 mg total) by mouth daily.   sucralfate 1 g tablet Commonly known as:  CARAFATE Take 1 tablet (1 g total) by mouth 4 (four) times daily -  with meals and at bedtime.   traMADol 50 MG tablet Commonly known as:  ULTRAM Take 1 tablet (50 mg total) by mouth every 12 (twelve) hours as needed.       All past medical  history, surgical history, allergies, family history, immunizations andmedications were updated in the EMR today and reviewed under the history and medication portions of their EMR.     ROS: Negative, with the exception of above mentioned in HPI   Objective:  BP 121/84 (BP Location: Left Arm, Patient Position: Sitting, Cuff Size: Normal)   Pulse 100   Temp 98 F (36.7 C)   Resp 20   Ht 5' 6.5" (1.689 m)   Wt 116 lb 4 oz (52.7 kg)   LMP 10/14/2017   SpO2 98%   BMI 18.48 kg/m  Body mass index is 18.48 kg/m.  Gen: Afebrile. No acute distress. Nontoxic in appearance, well developed, tearful today.  HENT: AT. [place].  MMM.  Eyes:Pupils Equal Round Reactive to light, Extraocular movements intact,  Conjunctiva without redness, discharge or icterus. CV: RRR Chest: CTAB, no wheeze or crackles Abd: Soft. flat. ND. BS present. no Masses palpated. No rebound, mild guarding. TTP epigastric and LLQ. MSK: no CVA tenderness. No spinal or paraspinal tenderness. NROM. NV intact distally.  Neuro:  Normal gait. PERLA. EOMi. Alert. Oriented X3  Psych: Normal affect, dress and demeanor. Normal speech. Normal thought content and judgment.   No exam data present No results found. Results for orders placed or performed in visit on 10/28/17 (from the past 24 hour(s))  POCT Urinalysis Dipstick (Automated)     Status: None   Collection Time: 10/28/17  2:31 PM  Result Value Ref Range   Color, UA yellow    Clarity, UA clear    Glucose, UA negative    Bilirubin, UA negative    Ketones, UA 3+    Spec Grav, UA 1.020 1.010 - 1.025   Blood, UA negative    pH, UA 6.0 5.0 - 8.0   Protein, UA negative    Urobilinogen, UA 0.2 0.2 or 1.0 E.U./dL   Nitrite, UA negative    Leukocytes, UA Negative Negative  POCT urine pregnancy     Status: None   Collection Time: 10/28/17  2:35 PM  Result Value Ref Range   Preg Test, Ur Negative Negative    Assessment/Plan: Jessica Osborne is a 42 y.o. female present for OV  for  Gastroesophageal reflux disease, esophagitis presence not specified Abdominal pain, > 3 months.  Originally epigastric --> now diffuse - worsening abd pain of 3 months duration with epigastric, LLQ and Left back discomfort.  - discussed case with her and her symptoms are the same as symptoms as prior but worsening, with new sx.  She has upcoming EGD w/ Dr Raquel James. Advised her  to call GI office to inform them her pain is worsening and if they can see about moving up EGD.  - continue protonix and carafate - If pain worsening before CT or GI/EGD-- go to ED - CMET and U preg (NEGATIVE)  collected today.  - UA negative - CT ABD/pelvis  - f/u dependant on CT results.   Back pain: - unknown cause. Possibly MSK. Could be kidney related, appropriate location. CMET collected. CT ordered for abd pain and new/worsening sx - tramadol prescribed to help her get enough comfort to sleep at night. Short course only.  [facility] database reviewed - POCT urinalysis> NEG - dependent on CT and clinical response.    Reviewed expectations re: course of current medical issues.  Discussed self-management of symptoms.  Outlined signs and symptoms indicating need for more acute intervention.  Patient verbalized understanding and all questions were answered.  Patient received an After-Visit Summary.    Orders Placed This Encounter  Procedures  . CT Abdomen Pelvis W Contrast  . Comp Met (CMET)  . POCT Urinalysis Dipstick (Automated)  . POCT urine pregnancy     Note is dictated utilizing voice recognition software. Although note has been proof read prior to signing, occasional typographical errors still can be missed. If any questions arise, please do not hesitate to call for verification.   electronically signed by:  Howard Pouch, DO  [place]

## 2017-10-28 NOTE — Patient Instructions (Addendum)
Miralax taper daily with 8 ounces of water, start at 1 cap a day and then increase to twice a day if needed.   If pain increases please go to ED. First try to call Dr. Rhea BeltonPyrtle and get in to be seen sooner.    Continue meds prescribed by GI.  Tramadol prescribed for back pain, take only if moderate to severe.  They will call you to schedule your CT.

## 2017-10-29 ENCOUNTER — Telehealth: Payer: Self-pay

## 2017-10-29 NOTE — Telephone Encounter (Signed)
Please cancel or have diane cancel order for CT if pt no longer desiring.

## 2017-10-29 NOTE — Telephone Encounter (Signed)
After carefully reviewing the Lab result information with patient. She states she is feeling better today but would like  to cancel the CT due to cost and she already is schedule for an endoscopy with GI.

## 2017-10-30 ENCOUNTER — Other Ambulatory Visit: Payer: BLUE CROSS/BLUE SHIELD

## 2017-10-30 DIAGNOSIS — R1084 Generalized abdominal pain: Secondary | ICD-10-CM | POA: Diagnosis not present

## 2017-10-30 DIAGNOSIS — K2950 Unspecified chronic gastritis without bleeding: Secondary | ICD-10-CM | POA: Diagnosis not present

## 2017-10-30 DIAGNOSIS — R935 Abnormal findings on diagnostic imaging of other abdominal regions, including retroperitoneum: Secondary | ICD-10-CM | POA: Diagnosis not present

## 2017-10-30 DIAGNOSIS — R1033 Periumbilical pain: Secondary | ICD-10-CM | POA: Diagnosis not present

## 2017-10-30 DIAGNOSIS — Z880 Allergy status to penicillin: Secondary | ICD-10-CM | POA: Diagnosis not present

## 2017-10-30 DIAGNOSIS — Z79899 Other long term (current) drug therapy: Secondary | ICD-10-CM | POA: Diagnosis not present

## 2017-10-30 DIAGNOSIS — R1013 Epigastric pain: Secondary | ICD-10-CM | POA: Diagnosis not present

## 2017-10-30 DIAGNOSIS — R6881 Early satiety: Secondary | ICD-10-CM | POA: Diagnosis not present

## 2017-10-30 DIAGNOSIS — J45909 Unspecified asthma, uncomplicated: Secondary | ICD-10-CM | POA: Diagnosis not present

## 2017-10-30 DIAGNOSIS — R110 Nausea: Secondary | ICD-10-CM | POA: Diagnosis not present

## 2017-10-31 ENCOUNTER — Telehealth: Payer: Self-pay | Admitting: Internal Medicine

## 2017-10-31 DIAGNOSIS — K2970 Gastritis, unspecified, without bleeding: Secondary | ICD-10-CM | POA: Diagnosis not present

## 2017-10-31 DIAGNOSIS — R1010 Upper abdominal pain, unspecified: Secondary | ICD-10-CM | POA: Diagnosis not present

## 2017-10-31 DIAGNOSIS — R6881 Early satiety: Secondary | ICD-10-CM | POA: Diagnosis not present

## 2017-10-31 DIAGNOSIS — R1013 Epigastric pain: Secondary | ICD-10-CM | POA: Diagnosis not present

## 2017-10-31 DIAGNOSIS — R12 Heartburn: Secondary | ICD-10-CM | POA: Diagnosis not present

## 2017-10-31 DIAGNOSIS — K2950 Unspecified chronic gastritis without bleeding: Secondary | ICD-10-CM | POA: Diagnosis not present

## 2017-10-31 DIAGNOSIS — R140 Abdominal distension (gaseous): Secondary | ICD-10-CM | POA: Diagnosis not present

## 2017-11-01 DIAGNOSIS — R1010 Upper abdominal pain, unspecified: Secondary | ICD-10-CM | POA: Diagnosis not present

## 2017-11-03 MED ORDER — GENERIC EXTERNAL MEDICATION
Status: DC
Start: ? — End: 2017-11-03

## 2017-11-03 MED ORDER — SODIUM CHLORIDE 0.9 % IV SOLN
INTRAVENOUS | Status: DC
Start: ? — End: 2017-11-03

## 2017-11-03 MED ORDER — SENNA-DOCUSATE SODIUM 8.6-50 MG PO TABS
ORAL_TABLET | ORAL | Status: DC
Start: 2017-11-02 — End: 2017-11-03

## 2017-11-03 MED ORDER — PANTOPRAZOLE SODIUM 40 MG PO TBEC
40.00 | DELAYED_RELEASE_TABLET | ORAL | Status: DC
Start: 2017-11-01 — End: 2017-11-03

## 2017-11-03 MED ORDER — LACTATED RINGERS IV SOLN
INTRAVENOUS | Status: DC
Start: ? — End: 2017-11-03

## 2017-11-03 MED ORDER — MORPHINE SULFATE (PF) 4 MG/ML IV SOLN
2.00 | INTRAVENOUS | Status: DC
Start: ? — End: 2017-11-03

## 2017-11-03 MED ORDER — DIPHENHYDRAMINE HCL 25 MG PO CAPS
25.00 | ORAL_CAPSULE | ORAL | Status: DC
Start: ? — End: 2017-11-03

## 2017-11-03 MED ORDER — HYDROCODONE-ACETAMINOPHEN 5-325 MG PO TABS
ORAL_TABLET | ORAL | Status: DC
Start: ? — End: 2017-11-03

## 2017-11-03 MED ORDER — ACETAMINOPHEN 325 MG PO TABS
650.00 | ORAL_TABLET | ORAL | Status: DC
Start: ? — End: 2017-11-03

## 2017-11-03 MED ORDER — ALBUTEROL SULFATE (2.5 MG/3ML) 0.083% IN NEBU
2.50 | INHALATION_SOLUTION | RESPIRATORY_TRACT | Status: DC
Start: ? — End: 2017-11-03

## 2017-11-04 ENCOUNTER — Ambulatory Visit: Payer: BLUE CROSS/BLUE SHIELD | Admitting: Physician Assistant

## 2017-11-04 ENCOUNTER — Encounter: Payer: BLUE CROSS/BLUE SHIELD | Admitting: Internal Medicine

## 2017-11-14 DIAGNOSIS — R109 Unspecified abdominal pain: Secondary | ICD-10-CM | POA: Diagnosis not present

## 2017-11-18 ENCOUNTER — Encounter: Payer: BLUE CROSS/BLUE SHIELD | Admitting: Internal Medicine

## 2017-11-20 DIAGNOSIS — K219 Gastro-esophageal reflux disease without esophagitis: Secondary | ICD-10-CM | POA: Diagnosis not present

## 2017-11-20 DIAGNOSIS — G4700 Insomnia, unspecified: Secondary | ICD-10-CM | POA: Diagnosis not present

## 2017-11-20 DIAGNOSIS — N943 Premenstrual tension syndrome: Secondary | ICD-10-CM | POA: Diagnosis not present

## 2017-11-20 DIAGNOSIS — R5383 Other fatigue: Secondary | ICD-10-CM | POA: Diagnosis not present

## 2017-11-21 DIAGNOSIS — R1013 Epigastric pain: Secondary | ICD-10-CM | POA: Diagnosis not present

## 2017-11-21 DIAGNOSIS — R194 Change in bowel habit: Secondary | ICD-10-CM | POA: Diagnosis not present

## 2017-11-21 DIAGNOSIS — R634 Abnormal weight loss: Secondary | ICD-10-CM | POA: Diagnosis not present

## 2017-11-21 DIAGNOSIS — R140 Abdominal distension (gaseous): Secondary | ICD-10-CM | POA: Diagnosis not present

## 2017-11-27 DIAGNOSIS — K219 Gastro-esophageal reflux disease without esophagitis: Secondary | ICD-10-CM | POA: Diagnosis not present

## 2017-12-04 ENCOUNTER — Ambulatory Visit: Payer: BLUE CROSS/BLUE SHIELD | Admitting: Family Medicine

## 2017-12-31 DIAGNOSIS — R5383 Other fatigue: Secondary | ICD-10-CM | POA: Diagnosis not present

## 2017-12-31 DIAGNOSIS — G4700 Insomnia, unspecified: Secondary | ICD-10-CM | POA: Diagnosis not present

## 2017-12-31 DIAGNOSIS — F419 Anxiety disorder, unspecified: Secondary | ICD-10-CM | POA: Diagnosis not present

## 2017-12-31 DIAGNOSIS — M2550 Pain in unspecified joint: Secondary | ICD-10-CM | POA: Diagnosis not present

## 2017-12-31 DIAGNOSIS — N943 Premenstrual tension syndrome: Secondary | ICD-10-CM | POA: Diagnosis not present

## 2017-12-31 DIAGNOSIS — L709 Acne, unspecified: Secondary | ICD-10-CM | POA: Diagnosis not present

## 2017-12-31 DIAGNOSIS — R7989 Other specified abnormal findings of blood chemistry: Secondary | ICD-10-CM | POA: Diagnosis not present

## 2017-12-31 DIAGNOSIS — K219 Gastro-esophageal reflux disease without esophagitis: Secondary | ICD-10-CM | POA: Diagnosis not present

## 2017-12-31 DIAGNOSIS — E039 Hypothyroidism, unspecified: Secondary | ICD-10-CM | POA: Diagnosis not present

## 2018-01-21 DIAGNOSIS — R220 Localized swelling, mass and lump, head: Secondary | ICD-10-CM | POA: Diagnosis not present

## 2018-02-19 NOTE — Telephone Encounter (Signed)
EGD done at hospital.

## 2018-02-27 DIAGNOSIS — N9411 Superficial (introital) dyspareunia: Secondary | ICD-10-CM | POA: Diagnosis not present

## 2018-02-27 DIAGNOSIS — Z1151 Encounter for screening for human papillomavirus (HPV): Secondary | ICD-10-CM | POA: Diagnosis not present

## 2018-02-27 DIAGNOSIS — F419 Anxiety disorder, unspecified: Secondary | ICD-10-CM | POA: Diagnosis not present

## 2018-02-27 DIAGNOSIS — Z681 Body mass index (BMI) 19 or less, adult: Secondary | ICD-10-CM | POA: Diagnosis not present

## 2018-02-27 DIAGNOSIS — Z01419 Encounter for gynecological examination (general) (routine) without abnormal findings: Secondary | ICD-10-CM | POA: Diagnosis not present

## 2018-03-10 DIAGNOSIS — G4700 Insomnia, unspecified: Secondary | ICD-10-CM | POA: Diagnosis not present

## 2018-03-10 DIAGNOSIS — R5383 Other fatigue: Secondary | ICD-10-CM | POA: Diagnosis not present

## 2018-03-10 DIAGNOSIS — R7989 Other specified abnormal findings of blood chemistry: Secondary | ICD-10-CM | POA: Diagnosis not present

## 2018-03-10 DIAGNOSIS — K219 Gastro-esophageal reflux disease without esophagitis: Secondary | ICD-10-CM | POA: Diagnosis not present

## 2018-03-25 DIAGNOSIS — R7989 Other specified abnormal findings of blood chemistry: Secondary | ICD-10-CM | POA: Diagnosis not present

## 2018-03-25 DIAGNOSIS — N943 Premenstrual tension syndrome: Secondary | ICD-10-CM | POA: Diagnosis not present

## 2018-03-25 DIAGNOSIS — R5383 Other fatigue: Secondary | ICD-10-CM | POA: Diagnosis not present

## 2018-03-25 DIAGNOSIS — G4700 Insomnia, unspecified: Secondary | ICD-10-CM | POA: Diagnosis not present

## 2018-05-06 DIAGNOSIS — H524 Presbyopia: Secondary | ICD-10-CM | POA: Diagnosis not present

## 2018-05-06 DIAGNOSIS — H52222 Regular astigmatism, left eye: Secondary | ICD-10-CM | POA: Diagnosis not present

## 2018-05-06 DIAGNOSIS — H5203 Hypermetropia, bilateral: Secondary | ICD-10-CM | POA: Diagnosis not present

## 2018-09-01 DIAGNOSIS — H04123 Dry eye syndrome of bilateral lacrimal glands: Secondary | ICD-10-CM | POA: Diagnosis not present

## 2018-09-15 ENCOUNTER — Telehealth: Payer: Self-pay

## 2018-09-15 NOTE — Telephone Encounter (Signed)
Copied from CRM [id number]. Topic: General - Other >> Sep 15, 2018  8:26 AM Percival Spanish wrote: Pt call to say her husband was diagn with the flu and is asking if tamaflu can be called in for her   Pharmacy  [facility]

## 2018-09-15 NOTE — Telephone Encounter (Signed)
Spoke with pt regarding request for tamiflu. I advised pt that the CDC doesn't recommend treating pts prophylactically prior to getting the flu. Advised pt to call the office if she develops any sxs r/t to the flu such as headaches, fever, cough, etc. Pt then stated that she does have these sxs, advised pt to make an appt an. Pt stated that she is out of town. Advised pt to schedule and e visit or go to a local urgent care.

## 2018-09-30 DIAGNOSIS — N943 Premenstrual tension syndrome: Secondary | ICD-10-CM | POA: Diagnosis not present

## 2018-09-30 DIAGNOSIS — G4700 Insomnia, unspecified: Secondary | ICD-10-CM | POA: Diagnosis not present

## 2018-09-30 DIAGNOSIS — F419 Anxiety disorder, unspecified: Secondary | ICD-10-CM | POA: Diagnosis not present

## 2018-09-30 DIAGNOSIS — R7989 Other specified abnormal findings of blood chemistry: Secondary | ICD-10-CM | POA: Diagnosis not present

## 2018-09-30 DIAGNOSIS — K219 Gastro-esophageal reflux disease without esophagitis: Secondary | ICD-10-CM | POA: Diagnosis not present

## 2018-09-30 DIAGNOSIS — R5383 Other fatigue: Secondary | ICD-10-CM | POA: Diagnosis not present

## 2019-05-29 DIAGNOSIS — Z01419 Encounter for gynecological examination (general) (routine) without abnormal findings: Secondary | ICD-10-CM | POA: Diagnosis not present

## 2019-05-29 DIAGNOSIS — Z3200 Encounter for pregnancy test, result unknown: Secondary | ICD-10-CM | POA: Diagnosis not present

## 2019-05-29 DIAGNOSIS — Z1231 Encounter for screening mammogram for malignant neoplasm of breast: Secondary | ICD-10-CM | POA: Diagnosis not present

## 2019-05-29 DIAGNOSIS — R109 Unspecified abdominal pain: Secondary | ICD-10-CM | POA: Diagnosis not present

## 2019-05-29 DIAGNOSIS — Z681 Body mass index (BMI) 19 or less, adult: Secondary | ICD-10-CM | POA: Diagnosis not present

## 2019-05-29 LAB — HM MAMMOGRAPHY

## 2019-06-02 ENCOUNTER — Other Ambulatory Visit: Payer: Self-pay

## 2019-06-02 ENCOUNTER — Telehealth: Payer: Self-pay

## 2019-06-02 NOTE — Telephone Encounter (Signed)
Abstracted Mammogram report into chart, ordered from [place]. 05/29/2019- Benign findings.

## 2019-06-12 DIAGNOSIS — R1031 Right lower quadrant pain: Secondary | ICD-10-CM | POA: Diagnosis not present

## 2019-07-02 DIAGNOSIS — Z20828 Contact with and (suspected) exposure to other viral communicable diseases: Secondary | ICD-10-CM | POA: Diagnosis not present

## 2019-07-03 DIAGNOSIS — Z20828 Contact with and (suspected) exposure to other viral communicable diseases: Secondary | ICD-10-CM | POA: Diagnosis not present

## 2020-06-01 LAB — PULMONARY FUNCTION TEST
DL/VA % pred: 100 %
DL/VA: 5.05 ml/min/mmHg/L
DLCO unc % pred: 80 %
DLCO unc: 21.8 ml/min/mmHg
FEF 25-75 Post: 2.75 L/sec
FEF 25-75 Pre: 2.44 L/sec
FEF2575-%Change-Post: 12 %
FEF2575-%Pred-Post: 83 %
FEF2575-%Pred-Pre: 74 %
FEV1-%Change-Post: 2 %
FEV1-%Pred-Post: 74 %
FEV1-%Pred-Pre: 72 %
FEV1-Post: 2.39 L
FEV1-Pre: 2.33 L
FEV1FVC-%Change-Post: 0 %
FEV1FVC-%Pred-Pre: 100 %
FEV6-%Change-Post: 3 %
FEV6-%Pred-Post: 74 %
FEV6-%Pred-Pre: 72 %
FEV6-Post: 2.91 L
FEV6-Pre: 2.82 L
FEV6FVC-%Pred-Post: 102 %
FEV6FVC-%Pred-Pre: 102 %
FVC-%Change-Post: 3 %
FVC-%Pred-Post: 73 %
FVC-%Pred-Pre: 71 %
FVC-Post: 2.91 L
Post FEV1/FVC ratio: 82 %
Post FEV6/FVC ratio: 100 %
Pre FEV1/FVC ratio: 83 %
Pre FEV6/FVC Ratio: 100 %
RV % pred: 179 %
RV: 2.99 L
TLC % pred: 83 %
TLC: 4.48 L
